# Patient Record
Sex: Female | Born: 1950
Health system: Southern US, Community
[De-identification: ages and names within clinical notes are randomized; demographics above are authoritative.]

## PROBLEM LIST (undated history)

## (undated) DIAGNOSIS — I10 Essential (primary) hypertension: Secondary | ICD-10-CM

## (undated) DIAGNOSIS — D869 Sarcoidosis, unspecified: Secondary | ICD-10-CM

## (undated) DIAGNOSIS — J449 Chronic obstructive pulmonary disease, unspecified: Secondary | ICD-10-CM

## (undated) HISTORY — DX: Sarcoidosis, unspecified: D86.9

---

## 2019-03-01 ENCOUNTER — Ambulatory Visit (INDEPENDENT_AMBULATORY_CARE_PROVIDER_SITE_OTHER): Payer: Medicare Other

## 2019-03-01 ENCOUNTER — Other Ambulatory Visit: Payer: Self-pay

## 2019-03-01 ENCOUNTER — Encounter: Payer: Self-pay | Admitting: Emergency Medicine

## 2019-03-01 ENCOUNTER — Ambulatory Visit
Admission: EM | Admit: 2019-03-01 | Discharge: 2019-03-01 | Disposition: A | Discharge status: Home / Self Care | Payer: Medicare Other | Attending: Physician Assistant | Admitting: Physician Assistant

## 2019-03-01 DIAGNOSIS — Z760 Encounter for issue of repeat prescription: Secondary | ICD-10-CM

## 2019-03-01 DIAGNOSIS — J4551 Severe persistent asthma with (acute) exacerbation: Secondary | ICD-10-CM

## 2019-03-01 DIAGNOSIS — R0602 Shortness of breath: Secondary | ICD-10-CM | POA: Diagnosis not present

## 2019-03-01 DIAGNOSIS — J209 Acute bronchitis, unspecified: Secondary | ICD-10-CM

## 2019-03-01 DIAGNOSIS — R062 Wheezing: Secondary | ICD-10-CM

## 2019-03-01 DIAGNOSIS — Z20822 Contact with and (suspected) exposure to covid-19: Secondary | ICD-10-CM | POA: Diagnosis not present

## 2019-03-01 HISTORY — DX: Essential (primary) hypertension: I10

## 2019-03-01 HISTORY — DX: Chronic obstructive pulmonary disease, unspecified: J44.9

## 2019-03-01 LAB — POC SARS CORONAVIRUS 2 AG -  ED: SARS Coronavirus 2 Ag: NEGATIVE

## 2019-03-01 MED ORDER — ALBUTEROL SULFATE (2.5 MG/3ML) 0.083% IN NEBU: 2.5000 mg | INHALATION_SOLUTION | Freq: Four times a day (QID) | RESPIRATORY_TRACT | 0 refills | Status: DC | PRN

## 2019-03-01 MED ORDER — DOXYCYCLINE HYCLATE 100 MG PO CAPS: 100.0000 mg | ORAL_CAPSULE | Freq: Two times a day (BID) | ORAL | 0 refills | Status: DC

## 2019-03-01 MED ORDER — HYDROCHLOROTHIAZIDE 25 MG PO TABS: 25.0000 mg | ORAL_TABLET | Freq: Every day | ORAL | 0 refills | Status: DC

## 2019-03-01 MED ORDER — LOSARTAN POTASSIUM 50 MG PO TABS: 50.0000 mg | ORAL_TABLET | Freq: Every day | ORAL | 0 refills | Status: DC

## 2019-03-01 MED ORDER — ALBUTEROL SULFATE HFA 108 (90 BASE) MCG/ACT IN AERS: 2.0000 | INHALATION_SPRAY | Freq: Four times a day (QID) | RESPIRATORY_TRACT | 0 refills | Status: DC | PRN

## 2019-03-01 MED ORDER — PREDNISONE 50 MG PO TABS: 50.0000 mg | ORAL_TABLET | Freq: Every day | ORAL | 0 refills | Status: DC

## 2019-03-01 MED ORDER — METHYLPREDNISOLONE SODIUM SUCC 125 MG IJ SOLR
80.0000 mg | Freq: Once | INTRAMUSCULAR | Status: AC
  Administered 2019-03-01: 15:00:00 80 mg via INTRAMUSCULAR

## 2019-03-01 MED ORDER — MONTELUKAST SODIUM 10 MG PO TABS: 10.0000 mg | ORAL_TABLET | Freq: Every day | ORAL | 0 refills | Status: DC

## 2019-03-01 NOTE — ED Provider Notes (Signed)
[facility]    CSN: [id number] Arrival date & time: 03/01/19  1334      History   Chief Complaint Chief Complaint  Patient presents with   Asthma    HPI Carolyn Morton is a 69 y.o. female.   69 year old female with history of HTN, asthma/COPD comes in for 2-3 week history of asthma exacerbation that has worsened in the past couple of days. Has been unable to do exert due to shortness of breath/wheezing. No chest pain. Rhinorrhea, nasal congestion, cough, sore throat started couple weeks ago as well. Denies fever, chills, body aches. Denies abdominal pain, nausea, vomiting, diarrhea. Denies shortness of breath. No COVID test since current symptom started. Using albuterol every 4 hours with temporary relief. Was on Breo for maintenance, but ran out 1 month ago due to finance.   Never needed to use prednisone. Never admitted for asthma. History of pulmonary sarcoidosis, has not had flareup for 30+ years.      Past Medical History:  Diagnosis Date   COPD (chronic obstructive pulmonary disease) ([place])    Hypertension     There are no problems to display for this patient.   History reviewed. No pertinent surgical history.  OB History   No obstetric history on file.      Home Medications    Prior to Admission medications   Medication Sig Start Date End Date Taking? Authorizing Provider  fluticasone furoate-vilanterol (BREO ELLIPTA) 200-25 MCG/INH AEPB Inhale 1 puff into the lungs daily.   Yes [provider]  albuterol (PROVENTIL) (2.5 MG/3ML) 0.083% nebulizer solution Take 3 mLs (2.5 mg total) by nebulization every 6 (six) hours as needed for wheezing or shortness of breath. 03/01/19   Tasia Catchings, Kevron Patella V, PA-C  albuterol (VENTOLIN HFA) 108 (90 Base) MCG/ACT inhaler Inhale 2 puffs into the lungs every 6 (six) hours as needed for wheezing or shortness of breath. 03/01/19   Tasia Catchings, Nohemi Nicklaus V, PA-C  doxycycline (VIBRAMYCIN) 100 MG capsule Take 1 capsule (100 mg total) by  mouth 2 (two) times daily. 03/01/19   Tasia Catchings, Kinzleigh Kandler V, PA-C  hydrochlorothiazide (HYDRODIURIL) 25 MG tablet Take 1 tablet (25 mg total) by mouth daily. 03/01/19   Tasia Catchings, Jermiya Reichl V, PA-C  losartan (COZAAR) 50 MG tablet Take 1 tablet (50 mg total) by mouth daily. 03/01/19   Tasia Catchings, Kirill Chatterjee V, PA-C  montelukast (SINGULAIR) 10 MG tablet Take 1 tablet (10 mg total) by mouth at bedtime. 03/01/19   Ok Edwards, PA-C  predniSONE (DELTASONE) 50 MG tablet Take 1 tablet (50 mg total) by mouth daily with breakfast. 03/01/19   Ok Edwards, PA-C    Family History History reviewed. No pertinent family history.  Social History Social History   Tobacco Use   Smoking status: Never Smoker   Smokeless tobacco: Never Used  Substance Use Topics   Alcohol use: Not Currently   Drug use: Never     Allergies   Patient has no known allergies.   Review of Systems Review of Systems  Reason unable to perform ROS: See HPI as above.     Physical Exam Triage Vital Signs ED Triage Vitals  Enc Vitals Group     BP 03/01/19 1351 (!) 190/71     Pulse Rate 03/01/19 1351 95     Resp 03/01/19 1351 (!) 22     Temp 03/01/19 1351 98.4 F (36.9 C)     Temp Source 03/01/19 1351 Oral     SpO2 03/01/19 1351 90 %  Weight --      Height --      Head Circumference --      Peak Flow --      Pain Score 03/01/19 1352 0     Pain Loc --      Pain Edu? --      Excl. in [place]? --    No data found.  Updated Vital Signs BP (!) 161/83 (BP Location: Left Arm)    Pulse 95    Temp 98.4 F (36.9 C) (Oral)    Resp (!) 22    SpO2 93%   Physical Exam Constitutional:      General: She is not in acute distress.    Appearance: Normal appearance. She is not ill-appearing, toxic-appearing or diaphoretic.  HENT:     Head: Normocephalic and atraumatic.     Mouth/Throat:     Mouth: Mucous membranes are moist.     Pharynx: Oropharynx is clear. Uvula midline.  Cardiovascular:     Rate and Rhythm: Normal rate and regular rhythm.     Heart sounds: Normal  heart sounds. No murmur. No friction rub. No gallop.   Pulmonary:     Effort: No accessory muscle usage, prolonged expiration or retractions.     Comments: Speaking full sentences without difficulty, though with audible wheezing.  Lung with diffuse inspiratory and expiratory wheezing, decreased air movement. Musculoskeletal:     Cervical back: Normal range of motion and neck supple.     Right lower leg: No edema.     Left lower leg: No edema.  Skin:    General: Skin is warm and dry.  Neurological:     General: No focal deficit present.     Mental Status: She is alert and oriented to person, place, and time.      UC Treatments / Results  Labs (all labs ordered are listed, but only abnormal results are displayed) Labs Reviewed  NOVEL CORONAVIRUS, NAA  POC SARS CORONAVIRUS 2 AG -  ED    EKG   Radiology DG Chest 2 View  Result Date: 03/01/2019 CLINICAL DATA:  69 year old female with wheezing shortness of breath and hypoxia EXAM: CHEST - 2 VIEW COMPARISON:  None. FINDINGS: Cardiomediastinal silhouette borderline enlarged. Calcifications of the aortic arch. No interlobular septal thickening. No pneumothorax or pleural effusion. Coarsened interstitial markings. No confluent airspace disease. Degenerative changes of the spine. IMPRESSION: Chronic lung changes without evidence of acute cardiopulmonary disease Electronically Signed   By: Gilmer Mor D.O.   On: 03/01/2019 14:31    Procedures Procedures (including critical care time)  Medications Ordered in UC Medications  methylPREDNISolone sodium succinate (SOLU-MEDROL) 125 mg/2 mL injection 80 mg (80 mg Intramuscular Given 03/01/19 1508)    Initial Impression / Assessment and Plan / UC Course  I have reviewed the triage vital signs and the nursing notes.  Pertinent labs & imaging results that were available during my care of the patient were reviewed by me and considered in my medical decision making (see chart for details).      69 year old female with history of asthma/COPD, pulmonary sarcoidosis, HTN comes in for 2 to 3 weeks of asthma exacerbation that has worsened in the past few days.  She is originally from [place], recently moved to [place].  Since moving, has not had Breo due to finances.  Has been using albuterol with temporary relief.  Denies history of prednisone use for asthma, denies history of admissions due to asthma exacerbations.  Patient was  tachypneic, with increased work of breathing at triage.  O2 sat ranging 87 to 90% on room air.  BP 190/71.  During exam, work of breathing normalized, and is able to speak in full sentences without tachypnea, though with audible wheezing.  Lungs with inspiratory and expiratory wheezing diffusely with decreased air movement.  Albuterol 4 puffs x1, good relief of symptoms.  Audible wheezing resolved.  Significantly improved air movement with inspiratory and expiratory wheezing to the periphery. O2 sat improved to 90-93%  Given history and exam, will obtain chest x-ray, rapid Covid.  Rapid Covid negative, will obtain PCR Covid.  Chest x-ray with chronic lung changes without evidence of acute cardiopulmonary disease.  Patient O2 sat ranging anywhere from 85% to 93%.  However, after albuterol inhaler treatment, O2 sat stable at 92 to 93%.  Given significant improvement of symptoms with albuterol, I believe her symptoms will improve with corticosteroid, DuoNeb/albuterol nebulizer treatments.  Unfortunately, unable to provide nebulizer treatments in office given Covid pandemic.  However, discussed concerns for hypoxia that may require admission.  At this time, discussed further evaluation at the ED versus treating for severe asthma exacerbation, and recheck in the morning.  Patient would like to defer ED visit at this time.  We will do Solu-Medrol injection in office.  Patient has nebulizer machine at home, to do 1 to 2-day DuoNeb treatments.  Start prednisone as directed.   Albuterol nebulizer/inhaler as needed.  If no worsening, follow-up tomorrow morning for recheck/reevaluation.  Discussed low threshold for ED visit.  Strict return precautions given.  Patient expresses understanding and agrees to plan.  Patient BP improved during visit, but does not currently have her BP meds.  Will refill HCTZ, losartan at this time.  Final Clinical Impressions(s) / UC Diagnoses   Final diagnoses:  Severe persistent asthma with acute exacerbation  Acute bronchitis, unspecified organism   ED Prescriptions    Medication Sig Dispense Auth. Provider   albuterol (PROVENTIL) (2.5 MG/3ML) 0.083% nebulizer solution Take 3 mLs (2.5 mg total) by nebulization every 6 (six) hours as needed for wheezing or shortness of breath. 75 mL Mareta Chesnut V, PA-C   albuterol (VENTOLIN HFA) 108 (90 Base) MCG/ACT inhaler Inhale 2 puffs into the lungs every 6 (six) hours as needed for wheezing or shortness of breath. 18 g Griffin Dewilde V, PA-C   predniSONE (DELTASONE) 50 MG tablet Take 1 tablet (50 mg total) by mouth daily with breakfast. 5 tablet Ferrel Simington V, PA-C   doxycycline (VIBRAMYCIN) 100 MG capsule Take 1 capsule (100 mg total) by mouth 2 (two) times daily. 20 capsule Khianna Blazina V, PA-C   hydrochlorothiazide (HYDRODIURIL) 25 MG tablet Take 1 tablet (25 mg total) by mouth daily. 30 tablet Marcey Persad V, PA-C   losartan (COZAAR) 50 MG tablet Take 1 tablet (50 mg total) by mouth daily. 30 tablet Camilla Skeen V, PA-C   montelukast (SINGULAIR) 10 MG tablet Take 1 tablet (10 mg total) by mouth at bedtime. 30 tablet Belinda Fisher, PA-C     PDMP not reviewed this encounter.   Belinda Fisher, PA-C 03/01/19 1555

## 2019-03-01 NOTE — ED Triage Notes (Signed)
Pt here for increased SOB with productive cough; wheezing noted with hx of asthma

## 2019-03-01 NOTE — Discharge Instructions (Signed)
Checks x-ray without pneumonia.  Rapid Covid negative.  As discussed, your oxygen level is lower than normal, this could improve with treatment and steroids, but may need to be at the emergency department.  Solu-Medrol injection in office today.  Covid testing ordered.  Please start prednisone, doxycycline as directed.  Please do 1-2 treatments of DuoNeb, and then you can continue with albuterol nebulizer treatments as needed.  If symptoms does not worsen overnight, please follow-up tomorrow morning for recheck.  If worsening symptoms, please go to the emergency department immediately for further evaluation.

## 2019-03-02 ENCOUNTER — Ambulatory Visit
Admission: EM | Admit: 2019-03-02 | Discharge: 2019-03-02 | Disposition: A | Discharge status: Home / Self Care | Payer: Medicare Other | Attending: Emergency Medicine | Admitting: Emergency Medicine

## 2019-03-02 ENCOUNTER — Encounter: Payer: Self-pay | Admitting: Emergency Medicine

## 2019-03-02 ENCOUNTER — Other Ambulatory Visit: Payer: Self-pay

## 2019-03-02 DIAGNOSIS — J4551 Severe persistent asthma with (acute) exacerbation: Secondary | ICD-10-CM

## 2019-03-02 DIAGNOSIS — J209 Acute bronchitis, unspecified: Secondary | ICD-10-CM

## 2019-03-02 LAB — NOVEL CORONAVIRUS, NAA: SARS-CoV-2, NAA: NOT DETECTED

## 2019-03-02 MED ORDER — BENZONATATE 100 MG PO CAPS: 100.0000 mg | ORAL_CAPSULE | Freq: Three times a day (TID) | ORAL | 0 refills | Status: DC

## 2019-03-02 NOTE — ED Provider Notes (Signed)
[facility]    CSN: [id number] Arrival date & time: 03/02/19  0843      History   Chief Complaint Chief Complaint  Patient presents with  . Asthma    HPI Carolyn Morton is a 69 y.o. female with history of hypertension, asthma/COPD presenting for repeat evaluation.  Patient was seen in this UC yesterday for severe persistent asthma with acute exacerbation.  Please see those records which were reviewed by me at time of visit.  Patient given IM Solu-Medrol in office and underwent rapid Covid testing: Negative-PCR's pending still at time of assessment today.  Patient was initially hypoxic 85% on room air, though after albuterol inhaler treatment got up to 93% on room air.  Patient was noted to have labored breathing, though due to improved oxygenation and appearance after albuterol inhaler, patient given nebulizer, prednisone, doxycycline for suspected asthma exacerbation and bronchitis as well as refill of home losartan, HCTZ, Singulair.  Since previous evaluation, patient has noticed significant improvement in her breathing.  States "the wheezing is gone ".  Has been compliant with all medications and is able to state how she took each medication.  Patient denies lightheadedness, dizziness, chest pain, shortness of breath in office today.  Past Medical History:  Diagnosis Date  . COPD (chronic obstructive pulmonary disease) ([facility])   . Hypertension     There are no problems to display for this patient.   History reviewed. No pertinent surgical history.  OB History   No obstetric history on file.      Home Medications    Prior to Admission medications   Medication Sig Start Date End Date Taking? Authorizing Provider  albuterol (PROVENTIL) (2.5 MG/3ML) 0.083% nebulizer solution Take 3 mLs (2.5 mg total) by nebulization every 6 (six) hours as needed for wheezing or shortness of breath. 03/01/19   Cathie Hoops, Amy V, PA-C  albuterol (VENTOLIN HFA) 108 (90 Base) MCG/ACT inhaler Inhale  2 puffs into the lungs every 6 (six) hours as needed for wheezing or shortness of breath. 03/01/19   Cathie Hoops, Amy V, PA-C  benzonatate (TESSALON) 100 MG capsule Take 1 capsule (100 mg total) by mouth every 8 (eight) hours. 03/02/19   Hall-Potvin, [place], PA-C  doxycycline (VIBRAMYCIN) 100 MG capsule Take 1 capsule (100 mg total) by mouth 2 (two) times daily. 03/01/19   Cathie Hoops, Amy V, PA-C  fluticasone furoate-vilanterol (BREO ELLIPTA) 200-25 MCG/INH AEPB Inhale 1 puff into the lungs daily.    [provider]  hydrochlorothiazide (HYDRODIURIL) 25 MG tablet Take 1 tablet (25 mg total) by mouth daily. 03/01/19   Cathie Hoops, Amy V, PA-C  losartan (COZAAR) 50 MG tablet Take 1 tablet (50 mg total) by mouth daily. 03/01/19   Cathie Hoops, Amy V, PA-C  montelukast (SINGULAIR) 10 MG tablet Take 1 tablet (10 mg total) by mouth at bedtime. 03/01/19   Belinda Fisher, PA-C  predniSONE (DELTASONE) 50 MG tablet Take 1 tablet (50 mg total) by mouth daily with breakfast. 03/01/19   Belinda Fisher, PA-C    Family History History reviewed. No pertinent family history.  Social History Social History   Tobacco Use  . Smoking status: Never Smoker  . Smokeless tobacco: Never Used  Substance Use Topics  . Alcohol use: Not Currently  . Drug use: Never     Allergies   Patient has no known allergies.   Review of Systems As per HPI   Physical Exam Triage Vital Signs ED Triage Vitals  Enc Vitals Group  BP 03/02/19 0851 (!) 187/104     Pulse Rate 03/02/19 0851 94     Resp 03/02/19 0851 20     Temp 03/02/19 0851 97.8 F (36.6 C)     Temp Source 03/02/19 0851 Temporal     SpO2 03/02/19 0851 90 %     Weight --      Height --      Head Circumference --      Peak Flow --      Pain Score 03/02/19 0852 0     Pain Loc --      Pain Edu? --      Excl. in [place]? --    No data found.  Updated Vital Signs BP (!) 187/104 (BP Location: Left Arm)   Pulse 94   Temp 97.8 F (36.6 C) (Temporal)   Resp 20   SpO2 90%   Visual Acuity Right Eye  Distance:   Left Eye Distance:   Bilateral Distance:    Right Eye Near:   Left Eye Near:    Bilateral Near:     Physical Exam Constitutional:      General: She is not in acute distress.    Appearance: She is obese. She is not ill-appearing or diaphoretic.  HENT:     Head: Normocephalic and atraumatic.     Mouth/Throat:     Mouth: Mucous membranes are moist.     Pharynx: Oropharynx is clear. No oropharyngeal exudate or posterior oropharyngeal erythema.  Eyes:     General: No scleral icterus.    Conjunctiva/sclera: Conjunctivae normal.     Pupils: Pupils are equal, round, and reactive to light.  Neck:     Comments: Trachea midline, negative JVD Cardiovascular:     Rate and Rhythm: Normal rate and regular rhythm.     Heart sounds: No murmur. No gallop.   Pulmonary:     Effort: Pulmonary effort is normal. No respiratory distress.     Breath sounds: No rhonchi or rales.     Comments: As compared to notes from previous visit yesterday, patient has normal rate and effort of breathing, very conversational today, and airflow is improved without wheezing Chest:     Chest wall: No tenderness.  Musculoskeletal:     Cervical back: Neck supple. No tenderness.  Lymphadenopathy:     Cervical: No cervical adenopathy.  Skin:    Capillary Refill: Capillary refill takes less than 2 seconds.     Coloration: Skin is not jaundiced or pale.     Findings: No rash.  Neurological:     General: No focal deficit present.     Mental Status: She is alert and oriented to person, place, and time.      UC Treatments / Results  Labs (all labs ordered are listed, but only abnormal results are displayed) Labs Reviewed - No data to display  EKG   Radiology   Procedures Procedures (including critical care time)  Medications Ordered in UC Medications - No data to display  Initial Impression / Assessment and Plan / UC Course  I have reviewed the triage vital signs and the nursing  notes.  Pertinent labs & imaging results that were available during my care of the patient were reviewed by me and considered in my medical decision making (see chart for details).     Patient afebrile, nontoxic in office today.  Patient ambulated into office, oxygen saturation 86% on room air, though quickly came up to 91%.  Patient waxing and  waning between 91-94% on room air.  No breathing treatments given today in office due to improvement.  Will await Covid PCR results, add Tessalon for trigger cough as this agitates patient.  This provider coordinated care: Patient has PCP appointment 03/16/2019: Intends to keep.  Strict ER return precautions discussed, patient verbalized understanding and is agreeable to plan.  Greater than 30 minutes were spent reviewing patient's case, at bedside, counseling, and coordinating care.  Final Clinical Impressions(s) / UC Diagnoses   Final diagnoses:  Severe persistent asthma with acute exacerbation  Acute bronchitis, unspecified organism     Discharge Instructions     Please use your spacing device to the albuterol inhaler. Please continue all medications given yesterday. If your Covid is positive, or you have difficulty breathing, chest pain, lightheadedness, nausea, please go to the ER for further evaluation.    ED Prescriptions    Medication Sig Dispense Auth. Provider   benzonatate (TESSALON) 100 MG capsule Take 1 capsule (100 mg total) by mouth every 8 (eight) hours. 21 capsule Hall-Potvin, [place], PA-C     PDMP not reviewed this encounter.   Hall-Potvin, [place], [place] 03/02/19 1706

## 2019-03-02 NOTE — ED Triage Notes (Signed)
Pt presents for re-check of O2 saturations after being seen yesterday for an asthma exacerbation.  Patient states she is feeling somewhat better.  States "the wheezing has stopped".

## 2019-03-02 NOTE — Discharge Instructions (Addendum)
Please use your spacing device to the albuterol inhaler. Please continue all medications given yesterday. If your Covid is positive, or you have difficulty breathing, chest pain, lightheadedness, nausea, please go to the ER for further evaluation.

## 2019-03-02 NOTE — ED Notes (Signed)
Patient able to ambulate independently  

## 2019-03-16 ENCOUNTER — Encounter: Payer: Self-pay | Admitting: Internal Medicine

## 2019-03-16 ENCOUNTER — Inpatient Hospital Stay: Payer: Medicare Other | Admitting: Internal Medicine

## 2019-03-16 ENCOUNTER — Ambulatory Visit (INDEPENDENT_AMBULATORY_CARE_PROVIDER_SITE_OTHER): Payer: Medicare Other | Admitting: Internal Medicine

## 2019-03-16 DIAGNOSIS — J45909 Unspecified asthma, uncomplicated: Secondary | ICD-10-CM | POA: Insufficient documentation

## 2019-03-16 DIAGNOSIS — Z7689 Persons encountering health services in other specified circumstances: Secondary | ICD-10-CM | POA: Diagnosis not present

## 2019-03-16 DIAGNOSIS — J4540 Moderate persistent asthma, uncomplicated: Secondary | ICD-10-CM | POA: Diagnosis not present

## 2019-03-16 DIAGNOSIS — I10 Essential (primary) hypertension: Secondary | ICD-10-CM

## 2019-03-16 MED ORDER — PREDNISONE 50 MG PO TABS: 50.0000 mg | ORAL_TABLET | Freq: Every day | ORAL | 0 refills | Status: DC

## 2019-03-16 NOTE — Progress Notes (Signed)
Virtual Visit via Telephone Note  I connected with Carolyn Morton, on 03/16/2019 at 3:12 PM by telephone due to the COVID-19 pandemic and verified that I am speaking with the correct person using two identifiers.   Consent: I discussed the limitations, risks, security and privacy concerns of performing an evaluation and management service by telephone and the availability of in person appointments. I also discussed with the patient that there may be a patient responsible charge related to this service. The patient expressed understanding and agreed to proceed.   Location of Patient: Home   Location of Provider: Home    Persons participating in Telemedicine visit: Wallace Going [facility] Dr. Juleen China      History of Present Illness: Patient has a visit to establish care. She has been to the urgent care twice for breathing. She is unsure if shsae has a diagnosis of asthma or COPD. Has been on Breo for about 3 years but currently does not have it because her insurance deductible was too high to afford. She has had trouble with breathing since she was a child. She finished her Doxycycline and Prednisone. Using albuterol inhaler and albuterol nebs about 3-4 times total per day. Has nighttime awakenings at least a few times per week. Denies chest pain. Also reports h/o of pulmonary sarcoidosis.  Has a history of HTN. Reports it had improved when she retired and her doctor in [place] was planning to take her off but since she moved here it has been high and she attributes that to difficulty with her wheezing. At home numbers have been 110-120 until recently.    Past Medical History:  Diagnosis Date  . COPD (chronic obstructive pulmonary disease) ([place])   . Hypertension    No Known Allergies  Current Outpatient Medications on File Prior to Visit  Medication Sig Dispense Refill  . albuterol (PROVENTIL) (2.5 MG/3ML) 0.083% nebulizer solution Take 3 mLs (2.5 mg total) by nebulization  every 6 (six) hours as needed for wheezing or shortness of breath. 75 mL 0  . albuterol (VENTOLIN HFA) 108 (90 Base) MCG/ACT inhaler Inhale 2 puffs into the lungs every 6 (six) hours as needed for wheezing or shortness of breath. 18 g 0  . benzonatate (TESSALON) 100 MG capsule Take 1 capsule (100 mg total) by mouth every 8 (eight) hours. 21 capsule 0  . fluticasone furoate-vilanterol (BREO ELLIPTA) 200-25 MCG/INH AEPB Inhale 1 puff into the lungs daily.    . hydrochlorothiazide (HYDRODIURIL) 25 MG tablet Take 1 tablet (25 mg total) by mouth daily. 30 tablet 0  . losartan (COZAAR) 50 MG tablet Take 1 tablet (50 mg total) by mouth daily. 30 tablet 0  . montelukast (SINGULAIR) 10 MG tablet Take 1 tablet (10 mg total) by mouth at bedtime. 30 tablet 0  . predniSONE (DELTASONE) 50 MG tablet Take 1 tablet (50 mg total) by mouth daily with breakfast. 5 tablet 0   No current facility-administered medications on file prior to visit.    Observations/Objective: NAD. Speaking clearly.  Patient wheezing on phone with periods of dry cough. Speaking in full sentences.  Alert and oriented. Mood appropriate.   Assessment and Plan: 1. Encounter to establish care   2. Moderate persistent asthma without complication Unclear if patient has COPD vs. Asthma. Does report breathing issues throughout her life, starting as a child making diagnosis of asthma more likely. No prior history of smoking, making COPD slightly less likely. Patient also reports history of pulmonary sarcoidosis. CXR on 2/7 showed  chronic lung changes with coarsened interstitial markings. No PFTs on file for review. Sounds to be having an acute exacerbation again, will treat with steroids. She may also benefit from a short acting bronchodilator such as Ipratropium with her exacerbations in the future. Suspect this is related to lack of a controller medication. Patient was unable to afford Breo. Seems to be her breathing status has worsened since  moving to  from [facility], there may be a component of environmental triggers as well. Will have patient have a consulting visit with Dr. Delford Field at [facility] for further assistance with management.  - predniSONE (DELTASONE) 50 MG tablet; Take 1 tablet (50 mg total) by mouth daily with breakfast.  Dispense: 5 tablet; Refill: 0  3. Essential hypertension BP sounds to be at goal at home. However, was significantly elevated at Urgent Care visits. Continue with current therapy for now. Will monitor in office.    Follow Up Instructions: Annual exam and screening labs; With Dr. Delford Field on 3/16    I discussed the assessment and treatment plan with the patient. The patient was provided an opportunity to ask questions and all were answered. The patient agreed with the plan and demonstrated an understanding of the instructions.   The patient was advised to call back or seek an in-person evaluation if the symptoms worsen or if the condition fails to improve as anticipated.     I provided 18 minutes total of non-face-to-face time during this encounter including median intraservice time, reviewing previous notes, investigations, ordering medications, medical decision making, coordinating care and patient verbalized understanding at the end of the visit.    Marcy Siren, D.O. Primary Care at [facility]  03/16/2019, 3:12 PM

## 2019-03-28 ENCOUNTER — Other Ambulatory Visit: Payer: Self-pay

## 2019-03-28 DIAGNOSIS — J4540 Moderate persistent asthma, uncomplicated: Secondary | ICD-10-CM

## 2019-03-30 ENCOUNTER — Other Ambulatory Visit: Payer: Self-pay | Admitting: Internal Medicine

## 2019-03-30 DIAGNOSIS — J4540 Moderate persistent asthma, uncomplicated: Secondary | ICD-10-CM

## 2019-03-30 MED ORDER — PREDNISONE 50 MG PO TABS: 50.0000 mg | ORAL_TABLET | Freq: Every day | ORAL | 0 refills | Status: DC

## 2019-04-06 NOTE — Progress Notes (Signed)
Subjective:    Patient ID: Carolyn Morton, female    DOB: 1951-01-19, 69 y.o.   MRN: [id number]  69 y.o.F here for pulmonary eval from PCP Earlene Plater Hx of severe asthma , two ED visits in Feb  The patient also has a history of sarcoidosis in the 1970s.  She had a liver biopsy that showed granulomas at that time.  She has had scarring in her lungs as well.  She had been on steroids in the past.  She recently moved here from [place] to be closer to her son is undergoing treatment for colon cancer.  She does note increased sinus congestion.  She wakes at night with shortness of breath notes increased wheezing.  She does have the albuterol inhaler and Singulair but she is not able to afford her combination inhalers Breo.  She did take prednisone for 5 days over a week ago and this had some improvement.    Shortness of Breath This is a chronic problem. The current episode started more than 1 year ago. The problem occurs daily. The problem has been rapidly improving (wiht pred pulse). Associated symptoms include a sore throat, sputum production and wheezing. Pertinent negatives include no abdominal pain, chest pain, ear pain, headaches, hemoptysis, leg pain, leg swelling, orthopnea, PND, rash, rhinorrhea, swollen glands or vomiting. The symptoms are aggravated by animal exposure, emotional upset, weather changes, odors, fumes and pollens (dogs). Risk factors include smoking. She has tried beta agonist inhalers and oral steroids for the symptoms. The treatment provided significant relief. Her past medical history is significant for asthma. (Sarcoid)    Past Medical History:  Diagnosis Date  . COPD (chronic obstructive pulmonary disease) (HCC)   . Hypertension      Family History  Family history unknown: Yes     Social History   Socioeconomic History  . Marital status: Widowed    Spouse name: Not on file  . Number of children: Not on file  . Years of education: Not on file  . Highest education  level: Not on file  Occupational History  . Not on file  Tobacco Use  . Smoking status: Never Smoker  . Smokeless tobacco: Never Used  Substance and Sexual Activity  . Alcohol use: Not Currently  . Drug use: Never  . Sexual activity: Not on file  Other Topics Concern  . Not on file  Social History Narrative  . Not on file   Social Determinants of Health   Financial Resource Strain:   . Difficulty of Paying Living Expenses:   Food Insecurity:   . Worried About Programme researcher, broadcasting/film/video in the Last Year:   . Barista in the Last Year:   Transportation Needs:   . Freight forwarder (Medical):   Marland Kitchen Lack of Transportation (Non-Medical):   Physical Activity:   . Days of Exercise per Week:   . Minutes of Exercise per Session:   Stress:   . Feeling of Stress :   Social Connections:   . Frequency of Communication with Friends and Family:   . Frequency of Social Gatherings with Friends and Family:   . Attends Religious Services:   . Active Member of Clubs or Organizations:   . Attends Banker Meetings:   Marland Kitchen Marital Status:   Intimate Partner Violence:   . Fear of Current or Ex-Partner:   . Emotionally Abused:   Marland Kitchen Physically Abused:   . Sexually Abused:      No Known  Allergies   Outpatient Medications Prior to Visit  Medication Sig Dispense Refill  . albuterol (PROVENTIL) (2.5 MG/3ML) 0.083% nebulizer solution Take 3 mLs (2.5 mg total) by nebulization every 6 (six) hours as needed for wheezing or shortness of breath. 75 mL 0  . albuterol (VENTOLIN HFA) 108 (90 Base) MCG/ACT inhaler Inhale 2 puffs into the lungs every 6 (six) hours as needed for wheezing or shortness of breath. 18 g 0  . hydrochlorothiazide (HYDRODIURIL) 25 MG tablet Take 1 tablet (25 mg total) by mouth daily. 30 tablet 0  . losartan (COZAAR) 50 MG tablet Take 1 tablet (50 mg total) by mouth daily. 30 tablet 0  . montelukast (SINGULAIR) 10 MG tablet Take 1 tablet (10 mg total) by mouth at  bedtime. 30 tablet 0  . benzonatate (TESSALON) 100 MG capsule Take 1 capsule (100 mg total) by mouth every 8 (eight) hours. (Patient not taking: Reported on 03/16/2019) 21 capsule 0  . fluticasone furoate-vilanterol (BREO ELLIPTA) 200-25 MCG/INH AEPB Inhale 1 puff into the lungs daily.    . predniSONE (DELTASONE) 50 MG tablet Take 1 tablet (50 mg total) by mouth daily with breakfast. (Patient not taking: Reported on 04/07/2019) 5 tablet 0   No facility-administered medications prior to visit.     Review of Systems  HENT: Positive for sore throat. Negative for ear pain and rhinorrhea.   Respiratory: Positive for sputum production, shortness of breath and wheezing. Negative for hemoptysis.   Cardiovascular: Negative for chest pain, orthopnea, leg swelling and PND.  Gastrointestinal: Negative for abdominal pain and vomiting.  Skin: Negative for rash.  Neurological: Negative for headaches.       Objective:   Physical Exam Vitals:   04/07/19 1008  BP: 129/84  Pulse: 80  SpO2: 92%  Weight: 203 lb 3.2 oz (92.2 kg)  Height: 5\' 3"  (1.6 m)    Gen: Pleasant, well-nourished, in no distress,  normal affect  ENT: No lesions,  mouth clear,  oropharynx clear, no postnasal drip  Neck: No JVD, no TMG, no carotid bruits  Lungs: No use of accessory muscles, no dullness to percussion, expired wheezes with poor airflow   cardiovascular: RRR, heart sounds normal, no murmur or gallops, no peripheral edema  Abdomen: soft and NT, no HSM,  BS normal  Musculoskeletal: No deformities, no cyanosis or clubbing  Neuro: alert, non focal  Skin: Warm, no lesions or rashes  Chest x-ray from February 2021 shows increased interstitial markings at the bases of the lungs and cardiomegaly       Assessment & Plan:  I personally reviewed all images and lab data in the [facility] system as well as any outside material available during this office visit and agree with the  radiology impressions.   Asthma History  and physical is compatible with severe persistent asthma  Plan will be to increase Flonase to 2 sprays each nostril daily and obtain Advair 200 strength 2 inhalations twice daily, she will continue Singulair 10 mg daily refills were given  We will also obtain a pulmonary function study  Sarcoidosis Sarcoidosis with history of liver biopsy we will check angiotensin-converting enzyme level to see if there is disease activity  Essential hypertension Patient is having difficulty with affording her co-pays so we combine the losartan and HCT to a 50/12.5 mg tablet I gave her a 90-day supply of this along with the Singulair to assist her with her co-pay costs   Diagnoses and all orders for this visit:  Moderate persistent  asthma without complication -     Pulmonary Function Test; Future  Sarcoidosis -     Angiotensin converting enzyme; Future -     Pulmonary Function Test; Future -     Angiotensin converting enzyme  Encounter for preoperative screening laboratory testing for COVID-19 virus -     Novel Coronavirus, NAA ([facility])  Essential hypertension  Other orders -     montelukast (SINGULAIR) 10 MG tablet; Take 1 tablet (10 mg total) by mouth at bedtime. -     losartan-hydrochlorothiazide (HYZAAR) 50-12.5 MG tablet; Take 1 tablet by mouth daily. -     fluticasone-salmeterol (ADVAIR HFA) 230-21 MCG/ACT inhaler; Inhale 2 puffs into the lungs 2 (two) times daily. -     albuterol (VENTOLIN HFA) 108 (90 Base) MCG/ACT inhaler; Inhale 2 puffs into the lungs every 6 (six) hours as needed for wheezing or shortness of breath. -     albuterol (PROVENTIL) (2.5 MG/3ML) 0.083% nebulizer solution; Take 3 mLs (2.5 mg total) by nebulization every 6 (six) hours as needed for wheezing or shortness of breath. -     fluticasone (FLONASE) 50 MCG/ACT nasal spray; Place 2 sprays into both nostrils daily.

## 2019-04-07 ENCOUNTER — Other Ambulatory Visit: Payer: Self-pay | Admitting: Critical Care Medicine

## 2019-04-07 ENCOUNTER — Other Ambulatory Visit: Payer: Self-pay

## 2019-04-07 ENCOUNTER — Encounter: Payer: Self-pay | Admitting: Critical Care Medicine

## 2019-04-07 ENCOUNTER — Ambulatory Visit: Payer: Medicare Other | Attending: Critical Care Medicine | Admitting: Critical Care Medicine

## 2019-04-07 VITALS — BP 129/84 | HR 80 | Ht 63.0 in | Wt 203.2 lb

## 2019-04-07 DIAGNOSIS — D869 Sarcoidosis, unspecified: Secondary | ICD-10-CM

## 2019-04-07 DIAGNOSIS — Z20822 Contact with and (suspected) exposure to covid-19: Secondary | ICD-10-CM | POA: Diagnosis not present

## 2019-04-07 DIAGNOSIS — I10 Essential (primary) hypertension: Secondary | ICD-10-CM | POA: Diagnosis not present

## 2019-04-07 DIAGNOSIS — J4540 Moderate persistent asthma, uncomplicated: Secondary | ICD-10-CM | POA: Diagnosis not present

## 2019-04-07 MED ORDER — ALBUTEROL SULFATE (2.5 MG/3ML) 0.083% IN NEBU: 2.5000 mg | INHALATION_SOLUTION | Freq: Four times a day (QID) | RESPIRATORY_TRACT | 0 refills | Status: DC | PRN

## 2019-04-07 MED ORDER — LOSARTAN POTASSIUM-HCTZ 50-12.5 MG PO TABS: 1.0000 | ORAL_TABLET | Freq: Every day | ORAL | 2 refills | Status: DC

## 2019-04-07 MED ORDER — MONTELUKAST SODIUM 10 MG PO TABS: 10.0000 mg | ORAL_TABLET | Freq: Every day | ORAL | 2 refills | Status: DC

## 2019-04-07 MED ORDER — ADVAIR HFA 230-21 MCG/ACT IN AERO: 2.0000 | INHALATION_SPRAY | Freq: Two times a day (BID) | RESPIRATORY_TRACT | 12 refills | Status: DC

## 2019-04-07 MED ORDER — ALBUTEROL SULFATE HFA 108 (90 BASE) MCG/ACT IN AERS: 2.0000 | INHALATION_SPRAY | Freq: Four times a day (QID) | RESPIRATORY_TRACT | 0 refills | Status: DC | PRN

## 2019-04-07 MED ORDER — FLUTICASONE PROPIONATE 50 MCG/ACT NA SUSP: 2.0000 | Freq: Every day | NASAL | 6 refills | Status: AC

## 2019-04-07 NOTE — Progress Notes (Signed)
New Patient  SOB

## 2019-04-07 NOTE — Assessment & Plan Note (Signed)
History and physical is compatible with severe persistent asthma  Plan will be to increase Flonase to 2 sprays each nostril daily and obtain Advair 200 strength 2 inhalations twice daily, she will continue Singulair 10 mg daily refills were given  We will also obtain a pulmonary function study

## 2019-04-07 NOTE — Assessment & Plan Note (Signed)
Sarcoidosis with history of liver biopsy we will check angiotensin-converting enzyme level to see if there is disease activity

## 2019-04-07 NOTE — Assessment & Plan Note (Signed)
Patient is having difficulty with affording her co-pays so we combine the losartan and HCT to a 50/12.5 mg tablet I gave her a 90-day supply of this along with the Singulair to assist her with her co-pay costs

## 2019-04-07 NOTE — Patient Instructions (Addendum)
A lung function test will be obtained  Labs today include angiotensin-converting enzyme assay  Start Advair 230 two puffs twice daily  Return to see Dr. Delford Field 1 month

## 2019-04-08 LAB — ANGIOTENSIN CONVERTING ENZYME: Angio Convert Enzyme: 31 U/L (ref 14–82)

## 2019-05-08 ENCOUNTER — Other Ambulatory Visit (HOSPITAL_COMMUNITY)
Admission: RE | Admit: 2019-05-08 | Discharge: 2019-05-08 | Disposition: A | Discharge status: Home / Self Care | Payer: Medicare Other | Source: Ambulatory Visit | Attending: Critical Care Medicine | Admitting: Critical Care Medicine

## 2019-05-08 DIAGNOSIS — Z01812 Encounter for preprocedural laboratory examination: Secondary | ICD-10-CM | POA: Diagnosis present

## 2019-05-08 DIAGNOSIS — Z20822 Contact with and (suspected) exposure to covid-19: Secondary | ICD-10-CM | POA: Insufficient documentation

## 2019-05-08 LAB — SARS CORONAVIRUS 2 (TAT 6-24 HRS): SARS Coronavirus 2: NEGATIVE

## 2019-05-11 ENCOUNTER — Ambulatory Visit (HOSPITAL_COMMUNITY)
Admission: RE | Admit: 2019-05-11 | Discharge: 2019-05-11 | Disposition: A | Discharge status: Home / Self Care | Payer: Medicare Other | Source: Ambulatory Visit | Attending: Critical Care Medicine | Admitting: Critical Care Medicine

## 2019-05-11 ENCOUNTER — Other Ambulatory Visit: Payer: Self-pay

## 2019-05-11 DIAGNOSIS — J4540 Moderate persistent asthma, uncomplicated: Secondary | ICD-10-CM | POA: Diagnosis present

## 2019-05-11 DIAGNOSIS — D869 Sarcoidosis, unspecified: Secondary | ICD-10-CM | POA: Diagnosis present

## 2019-05-11 LAB — PULMONARY FUNCTION TEST
DL/VA % pred: 88 %
DL/VA: 3.78 ml/min/mmHg/L
DLCO unc % pred: 59 %
DLCO unc: 10.1 ml/min/mmHg
FEF 25-75 Post: 0.6 L/sec
FEF 25-75 Pre: 0.45 L/sec
FEF2575-%Change-Post: 32 %
FEF2575-%Pred-Post: 41 %
FEF2575-%Pred-Pre: 31 %
FEV1-%Change-Post: 7 %
FEV1-%Pred-Post: 60 %
FEV1-%Pred-Pre: 56 %
FEV1-Post: 0.92 L
FEV1-Pre: 0.86 L
FEV1FVC-%Change-Post: 2 %
FEV1FVC-%Pred-Pre: 87 %
FEV6-%Change-Post: 4 %
FEV6-%Pred-Post: 70 %
FEV6-%Pred-Pre: 67 %
FEV6-Post: 1.32 L
FEV6-Pre: 1.26 L
FEV6FVC-%Change-Post: 0 %
FEV6FVC-%Pred-Post: 104 %
FEV6FVC-%Pred-Pre: 104 %
FVC-%Change-Post: 4 %
FVC-%Pred-Post: 67 %
FVC-%Pred-Pre: 64 %
FVC-Post: 1.32 L
FVC-Pre: 1.26 L
Post FEV1/FVC ratio: 69 %
Post FEV6/FVC ratio: 100 %
Pre FEV1/FVC ratio: 68 %
Pre FEV6/FVC Ratio: 100 %
RV % pred: 178 %
RV: 3.51 L
TLC % pred: 114 %
TLC: 5.09 L

## 2019-05-11 MED ORDER — ALBUTEROL SULFATE (2.5 MG/3ML) 0.083% IN NEBU
2.5000 mg | INHALATION_SOLUTION | Freq: Once | RESPIRATORY_TRACT | Status: AC
  Administered 2019-05-11: 2.5 mg via RESPIRATORY_TRACT

## 2019-05-13 ENCOUNTER — Other Ambulatory Visit: Payer: Self-pay

## 2019-05-13 ENCOUNTER — Encounter (HOSPITAL_COMMUNITY): Payer: Self-pay | Admitting: *Deleted

## 2019-05-13 ENCOUNTER — Telehealth (HOSPITAL_COMMUNITY): Payer: Self-pay

## 2019-05-13 ENCOUNTER — Ambulatory Visit: Payer: Medicare Other | Attending: Critical Care Medicine | Admitting: Critical Care Medicine

## 2019-05-13 ENCOUNTER — Encounter: Payer: Self-pay | Admitting: Critical Care Medicine

## 2019-05-13 VITALS — BP 138/76 | HR 92 | Temp 97.9°F | Resp 16 | Wt 203.6 lb

## 2019-05-13 DIAGNOSIS — H1013 Acute atopic conjunctivitis, bilateral: Secondary | ICD-10-CM | POA: Insufficient documentation

## 2019-05-13 DIAGNOSIS — J432 Centrilobular emphysema: Secondary | ICD-10-CM | POA: Diagnosis not present

## 2019-05-13 MED ORDER — LORATADINE 10 MG PO TABS: 10.0000 mg | ORAL_TABLET | Freq: Every day | ORAL | 4 refills | Status: DC

## 2019-05-13 MED ORDER — OLOPATADINE HCL 0.1 % OP SOLN: 1.0000 [drp] | Freq: Two times a day (BID) | OPHTHALMIC | 2 refills | Status: DC

## 2019-05-13 MED ORDER — SPIRIVA RESPIMAT 2.5 MCG/ACT IN AERS: INHALATION_SPRAY | RESPIRATORY_TRACT | 11 refills | Status: DC

## 2019-05-13 MED ORDER — BENZONATATE 100 MG PO CAPS: 100.0000 mg | ORAL_CAPSULE | Freq: Two times a day (BID) | ORAL | 0 refills | Status: DC | PRN

## 2019-05-13 NOTE — Patient Instructions (Addendum)
Pulmonary rehab referral was made  Start Spiriva two puff daily  Stay on Advair  Albuterol as needed  Patanol eye drops for eye irritation  Start loratidine daily for allergies  Resume flonase  Obtain Covid vaccine  Return 2 months  COVID-19 Vaccine Information can be found at: [email or web address] For questions related to vaccine distribution or appointments, please email [email or web address] or call [phone].

## 2019-05-13 NOTE — Progress Notes (Signed)
Subjective:    Patient ID: Carolyn Morton, female    DOB: 08/04/1950, 69 y.o.   MRN: [id number]  69 y.o.F here for pulmonary eval from PCP Carolyn Morton Hx of severe asthma , two ED visits in Feb  The patient also has a history of sarcoidosis in the 1970s.  She had a liver biopsy that showed granulomas at that time.  She has had scarring in her lungs as well.  She had been on steroids in the past.  She recently moved here from [place] to be closer to her son is undergoing treatment for colon cancer.  She does note increased sinus congestion.  She wakes at night with shortness of breath notes increased wheezing.  She does have the albuterol inhaler and Singulair but she is not able to afford her combination inhalers Breo.  She did take prednisone for 5 days over a week ago and this had some improvement.  05/13/2019 The patient returns today in follow-up after having had pulmonary function studies.  The patient's level of dyspnea is slightly improved on Advair but she still gets dyspneic with exertion and wakens at night with shortness of breath.  There is minimal dry cough.  Pulmonary functions do demonstrate reduction in diffusion capacity significant air trapping and severe peripheral airflow obstruction and moderate central airflow obstruction. Patient notes she had increased cough the last few weeks and is using Tessalon Perles.  She also notes eye irritation with the increased pollen as well.  She also notes increased nasal congestion.  Shortness of Breath This is a chronic problem. The current episode started more than 1 year ago. The problem occurs daily. The problem has been rapidly improving (wiht pred pulse). Associated symptoms include rhinorrhea, a sore throat and sputum production. Pertinent negatives include no abdominal pain, chest pain, ear pain, headaches, hemoptysis, leg pain, leg swelling, orthopnea, PND, rash, swollen glands, vomiting or wheezing. The symptoms are aggravated by animal  exposure, emotional upset, weather changes, odors, fumes and pollens (dogs). Associated symptoms comments: Mucus is clear Nose stopped up, eyes burning, sinus pressure on Left side Dry scratchy throat, better with gargle with salt water. Risk factors include smoking. She has tried beta agonist inhalers and oral steroids for the symptoms. The treatment provided significant relief. Her past medical history is significant for asthma and COPD. (Sarcoid)    Past Medical History:  Diagnosis Date  . COPD (chronic obstructive pulmonary disease) (HCC)   . Hypertension      Family History  Family history unknown: Yes     Social History   Socioeconomic History  . Marital status: Widowed    Spouse name: Not on file  . Number of children: Not on file  . Years of education: Not on file  . Highest education level: Not on file  Occupational History  . Not on file  Tobacco Use  . Smoking status: Never Smoker  . Smokeless tobacco: Never Used  Substance and Sexual Activity  . Alcohol use: Not Currently  . Drug use: Never  . Sexual activity: Not on file  Other Topics Concern  . Not on file  Social History Narrative  . Not on file   Social Determinants of Health   Financial Resource Strain:   . Difficulty of Paying Living Expenses:   Food Insecurity:   . Worried About Programme researcher, broadcasting/film/video in the Last Year:   . [employer] of Food in the Last Year:   Transportation Needs:   . Lack of  Transportation (Medical):   Marland Kitchen Lack of Transportation (Non-Medical):   Physical Activity:   . Days of Exercise per Week:   . Minutes of Exercise per Session:   Stress:   . Feeling of Stress :   Social Connections:   . Frequency of Communication with Friends and Family:   . Frequency of Social Gatherings with Friends and Family:   . Attends Religious Services:   . Active Member of Clubs or Organizations:   . Attends Archivist Meetings:   Marland Kitchen Marital Status:   Intimate Partner Violence:   . Fear of  Current or Ex-Partner:   . Emotionally Abused:   Marland Kitchen Physically Abused:   . Sexually Abused:      No Known Allergies   Outpatient Medications Prior to Visit  Medication Sig Dispense Refill  . albuterol (PROVENTIL) (2.5 MG/3ML) 0.083% nebulizer solution Take 3 mLs (2.5 mg total) by nebulization every 6 (six) hours as needed for wheezing or shortness of breath. 75 mL 0  . albuterol (VENTOLIN HFA) 108 (90 Base) MCG/ACT inhaler INHALE 2 PUFFS INTO THE LUNGS EVERY 6 HOURS AS NEEDED FOR WHEEZING OR SHORTNESS OF BREATH 54 g 1  . fluticasone (FLONASE) 50 MCG/ACT nasal spray Place 2 sprays into both nostrils daily. 16 g 6  . fluticasone-salmeterol (ADVAIR HFA) 230-21 MCG/ACT inhaler Inhale 2 puffs into the lungs 2 (two) times daily. 1 Inhaler 12  . losartan-hydrochlorothiazide (HYZAAR) 50-12.5 MG tablet Take 1 tablet by mouth daily. 90 tablet 2  . montelukast (SINGULAIR) 10 MG tablet Take 1 tablet (10 mg total) by mouth at bedtime. 90 tablet 2   No facility-administered medications prior to visit.     Review of Systems  HENT: Positive for rhinorrhea and sore throat. Negative for ear pain.   Respiratory: Positive for sputum production and shortness of breath. Negative for hemoptysis and wheezing.   Cardiovascular: Negative for chest pain, orthopnea, leg swelling and PND.  Gastrointestinal: Negative for abdominal pain and vomiting.  Skin: Negative for rash.  Neurological: Negative for headaches.       Objective:   Physical Exam Vitals:   05/13/19 0913  BP: 138/76  Pulse: 92  Resp: 16  Temp: 97.9 F (36.6 C)  SpO2: 92%  Weight: 203 lb 9.6 oz (92.4 kg)    Gen: Pleasant, well-nourished, in no distress,  normal affect  ENT: No lesions,  mouth clear,  oropharynx clear, 2+ postnasal drip, nasal turbinate edema without purulence  Neck: No JVD, no TMG, no carotid bruits  Lungs: No use of accessory muscles, no dullness to percussion, distant breath sounds but improved airflow  cardiovascular: RRR, heart sounds normal, no murmur or gallops, no peripheral edema  Abdomen: soft and NT, no HSM,  BS normal  Musculoskeletal: No deformities, no cyanosis or clubbing  Neuro: alert, non focal  Skin: Warm, no lesions or rashes  Chest x-ray from February 2021 shows increased interstitial markings at the bases of the lungs and cardiomegaly PFTS  Ref Range & Units 2 d ago  FVC-Pre L 1.26   FVC-%Pred-Pre % 64   FVC-Post L 1.32   FVC-%Pred-Post % 67   FVC-%Change-Post % 4   FEV1-Pre L 0.86   FEV1-%Pred-Pre % 56   FEV1-Post L 0.92   FEV1-%Pred-Post % 60   FEV1-%Change-Post % 7   FEV6-Pre L 1.26   FEV6-%Pred-Pre % 67   FEV6-Post L 1.32   FEV6-%Pred-Post % 70   FEV6-%Change-Post % 4   Pre FEV1/FVC ratio % 68  FEV1FVC-%Pred-Pre % 87   Post FEV1/FVC ratio % 69   FEV1FVC-%Change-Post % 2   Pre FEV6/FVC Ratio % 100   FEV6FVC-%Pred-Pre % 104   Post FEV6/FVC ratio % 100   FEV6FVC-%Pred-Post % 104   FEV6FVC-%Change-Post % 0   FEF 25-75 Pre L/sec 0.45   FEF2575-%Pred-Pre % 31   FEF 25-75 Post L/sec 0.60   FEF2575-%Pred-Post % 41   FEF2575-%Change-Post % 32   RV L 3.51   RV % pred % 178   TLC L 5.09   TLC % pred % 114   DLCO unc ml/min/mmHg 10.10   DLCO unc % pred % 59   DL/VA ml/min/mmHg/L 1.95   DL/VA % pred % 88          Assessment & Plan:  I personally reviewed all images and lab data in the [facility] system as well as any outside material available during this office visit and agree with the  radiology impressions.   Centrilobular emphysema (HCC)Gold B with asthma overlap syndrome Pulmonary functions most consistent with centrilobular emphysema with chronic obstructive lung disease Gold stage B with asthma overlap syndrome  Plan is to add Spiriva 2 puffs daily Respimat and continue Advair as prescribed  We will discontinue further Singulair and begin Flonase 2 sprays each nostril daily  Patient be referred to pulmonary rehab  We will also begin  Patanol eyedrops for allergic conjunctivitis and also begin loratadine daily   [place] was seen today for asthma.  Diagnoses and all orders for this visit:  Centrilobular emphysema (HCC) -     Pulmonary rehab therapeutic exercise; Future -     AMB referral to pulmonary rehabilitation  Other orders -     olopatadine (PATANOL) 0.1 % ophthalmic solution; Place 1 drop into both eyes 2 (two) times daily. -     loratadine (CLARITIN) 10 MG tablet; Take 1 tablet (10 mg total) by mouth daily. -     Tiotropium Bromide Monohydrate (SPIRIVA RESPIMAT) 2.5 MCG/ACT AERS; Two puff daily -     benzonatate (TESSALON) 100 MG capsule; Take 1 capsule (100 mg total) by mouth 2 (two) times daily as needed for cough.

## 2019-05-13 NOTE — Assessment & Plan Note (Signed)
Pulmonary functions most consistent with centrilobular emphysema with chronic obstructive lung disease Gold stage B with asthma overlap syndrome  Plan is to add Spiriva 2 puffs daily Respimat and continue Advair as prescribed  We will discontinue further Singulair and begin Flonase 2 sprays each nostril daily  Patient be referred to pulmonary rehab  We will also begin Patanol eyedrops for allergic conjunctivitis and also begin loratadine daily

## 2019-05-13 NOTE — Progress Notes (Signed)
Received referral from Dr. Shan Levans for this pt to participate in pulmonary rehab with the the diagnosis of Ccentrilobular Emphysema . Clinical review of pt follow up appt on 05/13/19 community health and wellness office note and 2/22 with PCP.  Pt recently moved from [place] to [place] and is establishing medical care.  Pt has noticed that his shortness of breath has worsen since moving to [place].  Pt will have PFT.  This has not been scheduled as of yet. Pt with Covid Risk Score - 4. Pt appropriate for scheduling for Pulmonary rehab.  Will forward to support staff for verification of insurance eligibility/benefits and to pulmonary rehab staff for scheduling  with pt consent. Alanson Aly, BSN Cardiac and Emergency planning/management officer

## 2019-05-13 NOTE — Telephone Encounter (Signed)
Pt insurance is active and benefits verified through [facility] Medicare Co-pay $20, DED 0/0 met, out of pocket $3,900/$45.95 met, co-insurance 0%. no pre-authorization required. Passport, 05/13/2019@1 :49pm, REF# [id number]

## 2019-05-20 ENCOUNTER — Telehealth (HOSPITAL_COMMUNITY): Payer: Self-pay | Admitting: *Deleted

## 2019-05-27 ENCOUNTER — Other Ambulatory Visit: Payer: Self-pay | Admitting: Internal Medicine

## 2019-05-27 DIAGNOSIS — F329 Major depressive disorder, single episode, unspecified: Secondary | ICD-10-CM | POA: Insufficient documentation

## 2019-05-27 DIAGNOSIS — F32A Depression, unspecified: Secondary | ICD-10-CM

## 2019-05-27 MED ORDER — DULOXETINE HCL 20 MG PO CPEP: 20.0000 mg | ORAL_CAPSULE | Freq: Every day | ORAL | 1 refills | Status: DC

## 2019-06-10 ENCOUNTER — Telehealth (HOSPITAL_COMMUNITY): Payer: Self-pay | Admitting: *Deleted

## 2019-07-06 ENCOUNTER — Telehealth: Payer: Self-pay | Admitting: Internal Medicine

## 2019-07-07 ENCOUNTER — Encounter: Payer: Self-pay | Admitting: Internal Medicine

## 2019-07-07 ENCOUNTER — Telehealth (INDEPENDENT_AMBULATORY_CARE_PROVIDER_SITE_OTHER): Payer: Medicare Other | Admitting: Internal Medicine

## 2019-07-07 DIAGNOSIS — J029 Acute pharyngitis, unspecified: Secondary | ICD-10-CM | POA: Diagnosis not present

## 2019-07-07 MED ORDER — AMOXICILLIN-POT CLAVULANATE 875-125 MG PO TABS: 1.0000 | ORAL_TABLET | Freq: Two times a day (BID) | ORAL | 0 refills | Status: DC

## 2019-07-07 MED ORDER — LIDOCAINE VISCOUS HCL 2 % MT SOLN: OROMUCOSAL | 0 refills | Status: DC

## 2019-07-07 NOTE — Progress Notes (Signed)
Virtual Visit via Telephone Note  I connected with Carolyn Morton, on 07/07/2019 at 9:31 AM by telephone due to the COVID-19 pandemic and verified that I am speaking with the correct person using two identifiers.   Consent: I discussed the limitations, risks, security and privacy concerns of performing an evaluation and management service by telephone and the availability of in person appointments. I also discussed with the patient that there may be a patient responsible charge related to this service. The patient expressed understanding and agreed to proceed.   Location of Patient: Home   Location of Provider: Clinic    Persons participating in Telemedicine visit: Deanna Wiater Going [facility] Dr. Juleen China   History of Present Illness: Patient has a visit for concerns about illness. Reports has non-stop sore throat. Has had problems with her sinuses all her life. Had surgery on her sinuses a few years ago but still gets hit with a sinus infection every now and then. Has been dealing with this for several weeks. Over the weekend was doing salt water gargles which helped with the pain and relieved some of the discomfort. Has been taking Tylenol and Ibuprofen. Having postnasal drip at nighttime. No fevers. Denies new cough. Has trouble seeing into the back of her throat to see if there is any pus or tonsillar edema. No trouble with swallowing liquids or solids.    Past Medical History:  Diagnosis Date  . COPD (chronic obstructive pulmonary disease) ([place])   . Hypertension    No Known Allergies  Current Outpatient Medications on File Prior to Visit  Medication Sig Dispense Refill  . albuterol (PROVENTIL) (2.5 MG/3ML) 0.083% nebulizer solution Take 3 mLs (2.5 mg total) by nebulization every 6 (six) hours as needed for wheezing or shortness of breath. 75 mL 0  . albuterol (VENTOLIN HFA) 108 (90 Base) MCG/ACT inhaler INHALE 2 PUFFS INTO THE LUNGS EVERY 6 HOURS AS NEEDED FOR WHEEZING OR  SHORTNESS OF BREATH 54 g 1  . DULoxetine (CYMBALTA) 20 MG capsule Take 1 capsule (20 mg total) by mouth daily. 90 capsule 1  . fluticasone (FLONASE) 50 MCG/ACT nasal spray Place 2 sprays into both nostrils daily. 16 g 6  . fluticasone-salmeterol (ADVAIR HFA) 230-21 MCG/ACT inhaler Inhale 2 puffs into the lungs 2 (two) times daily. 1 Inhaler 12  . loratadine (CLARITIN) 10 MG tablet Take 1 tablet (10 mg total) by mouth daily. 30 tablet 4  . losartan-hydrochlorothiazide (HYZAAR) 50-12.5 MG tablet Take 1 tablet by mouth daily. 90 tablet 2  . olopatadine (PATANOL) 0.1 % ophthalmic solution Place 1 drop into both eyes 2 (two) times daily. 5 mL 2  . Tiotropium Bromide Monohydrate (SPIRIVA RESPIMAT) 2.5 MCG/ACT AERS Two puff daily 4 g 11   No current facility-administered medications on file prior to visit.    Observations/Objective: NAD. Speaking clearly.  Work of breathing normal.  Alert and oriented. Mood appropriate.   Assessment and Plan: 1. Sore throat Limited due to virtual visit. Will treat with antibiotic that would cover bacterial sinusitis as well as streptococcal sore throat given duration of symptoms. Lidocaine gargles for pain relief. No red flag symptoms on history. Return if not improving with treatment plan.  - amoxicillin-clavulanate (AUGMENTIN) 875-125 MG tablet; Take 1 tablet by mouth 2 (two) times daily.  Dispense: 20 tablet; Refill: 0 - lidocaine (XYLOCAINE) 2 % solution; Gargle and spit out 15 mL every 6h prn throat pain.  Dispense: 100 mL; Refill: 0   Follow Up Instructions: PRN and for  routine medical care    I discussed the assessment and treatment plan with the patient. The patient was provided an opportunity to ask questions and all were answered. The patient agreed with the plan and demonstrated an understanding of the instructions.   The patient was advised to call back or seek an in-person evaluation if the symptoms worsen or if the condition fails to improve as  anticipated.     I provided 12 minutes total of non-face-to-face time during this encounter including median intraservice time, reviewing previous notes, investigations, ordering medications, medical decision making, coordinating care and patient verbalized understanding at the end of the visit.    Marcy Siren, D.O. Primary Care at [facility]  07/07/2019, 9:31 AM

## 2019-07-12 NOTE — Progress Notes (Signed)
Subjective:    Patient ID: Carolyn Morton, female    DOB: January 16, 1951, 69 y.o.   MRN: [id number]  68 y.o.F here for pulmonary eval from PCP Juleen China Hx of severe asthma , two ED visits in Feb  The patient also has a history of sarcoidosis in the 1970s.  She had a liver biopsy that showed granulomas at that time.  She has had scarring in her lungs as well.  She had been on steroids in the past.  She recently moved here from [place] to be closer to her son is undergoing treatment for colon cancer.  She does note increased sinus congestion.  She wakes at night with shortness of breath notes increased wheezing.  She does have the albuterol inhaler and Singulair but she is not able to afford her combination inhalers Breo.  She did take prednisone for 5 days over a week ago and this had some improvement.  05/13/2019 The patient returns today in follow-up after having had pulmonary function studies.  The patient's level of dyspnea is slightly improved on Advair but she still gets dyspneic with exertion and wakens at night with shortness of breath.  There is minimal dry cough.  Pulmonary functions do demonstrate reduction in diffusion capacity significant air trapping and severe peripheral airflow obstruction and moderate central airflow obstruction. Patient notes she had increased cough the last few weeks and is using Tessalon Perles.  She also notes eye irritation with the increased pollen as well.  She also notes increased nasal congestion.  6/21: Patient seen in return follow-up from April and is doing much better.  Patient's dyspnea and cough have improved.  She did not pick up the Patanol eyedrops as they were too expensive.  She is taking Flonase daily along with Zyrtec and is taking the Spiriva and the Advair.  The patient still has a bit of a cough and tends to clear her throat a lot.  She is been using cough drops.  Patient is due up a tetanus vaccine and Prevnar vaccine.  Note she did have a Covid  vaccine the last one was May 13 and was [employer]. The patient does have postnasal drip and will cough sometimes at night.  She takes Benadryl and Zyrtec over-the-counter which seems to help.  Tessalon Perles helped to some degree.   Shortness of Breath This is a chronic problem. The current episode started more than 1 year ago. The problem occurs daily. The problem has been rapidly improving (wiht pred pulse). Associated symptoms include rhinorrhea, a sore throat and sputum production. Pertinent negatives include no abdominal pain, chest pain, ear pain, headaches, hemoptysis, leg pain, leg swelling, orthopnea, PND, rash, swollen glands, vomiting or wheezing. The symptoms are aggravated by animal exposure, emotional upset, weather changes, odors, fumes and pollens (dogs). Associated symptoms comments: Mucus is clear Nose stopped up, eyes burning, sinus pressure on Left side Dry scratchy throat, better with gargle with salt water. Risk factors include smoking. She has tried beta agonist inhalers and oral steroids for the symptoms. The treatment provided significant relief. Her past medical history is significant for asthma and COPD. (Sarcoid)    Past Medical History:  Diagnosis Date  . COPD (chronic obstructive pulmonary disease) ([place])   . Hypertension      Family History  Family history unknown: Yes     Social History   Socioeconomic History  . Marital status: Widowed    Spouse name: Not on file  . Number of children: Not on file  .  Years of education: Not on file  . Highest education level: Not on file  Occupational History  . Not on file  Tobacco Use  . Smoking status: Never Smoker  . Smokeless tobacco: Never Used  Vaping Use  . Vaping Use: Former  Substance and Sexual Activity  . Alcohol use: Not Currently  . Drug use: Never  . Sexual activity: Not on file  Other Topics Concern  . Not on file  Social History Narrative  . Not on file   Social Determinants of Health    Financial Resource Strain:   . Difficulty of Paying Living Expenses:   Food Insecurity:   . Worried About Programme researcher, broadcasting/film/video in the Last Year:   . Barista in the Last Year:   Transportation Needs:   . Freight forwarder (Medical):   Marland Kitchen Lack of Transportation (Non-Medical):   Physical Activity:   . Days of Exercise per Week:   . Minutes of Exercise per Session:   Stress:   . Feeling of Stress :   Social Connections:   . Frequency of Communication with Friends and Family:   . Frequency of Social Gatherings with Friends and Family:   . Attends Religious Services:   . Active Member of Clubs or Organizations:   . Attends Banker Meetings:   Marland Kitchen Marital Status:   Intimate Partner Violence:   . Fear of Current or Ex-Partner:   . Emotionally Abused:   Marland Kitchen Physically Abused:   . Sexually Abused:      No Known Allergies   Outpatient Medications Prior to Visit  Medication Sig Dispense Refill  . albuterol (PROVENTIL) (2.5 MG/3ML) 0.083% nebulizer solution Take 3 mLs (2.5 mg total) by nebulization every 6 (six) hours as needed for wheezing or shortness of breath. 75 mL 0  . albuterol (VENTOLIN HFA) 108 (90 Base) MCG/ACT inhaler INHALE 2 PUFFS INTO THE LUNGS EVERY 6 HOURS AS NEEDED FOR WHEEZING OR SHORTNESS OF BREATH 54 g 1  . DULoxetine (CYMBALTA) 20 MG capsule Take 1 capsule (20 mg total) by mouth daily. 90 capsule 1  . fluticasone (FLONASE) 50 MCG/ACT nasal spray Place 2 sprays into both nostrils daily. 16 g 6  . fluticasone-salmeterol (ADVAIR HFA) 230-21 MCG/ACT inhaler Inhale 2 puffs into the lungs 2 (two) times daily. 1 Inhaler 12  . losartan-hydrochlorothiazide (HYZAAR) 50-12.5 MG tablet Take 1 tablet by mouth daily. 90 tablet 2  . olopatadine (PATANOL) 0.1 % ophthalmic solution Place 1 drop into both eyes 2 (two) times daily. 5 mL 2  . Tiotropium Bromide Monohydrate (SPIRIVA RESPIMAT) 2.5 MCG/ACT AERS Two puff daily 4 g 11  . amoxicillin-clavulanate  (AUGMENTIN) 875-125 MG tablet Take 1 tablet by mouth 2 (two) times daily. 20 tablet 0  . lidocaine (XYLOCAINE) 2 % solution Gargle and spit out 15 mL every 6h prn throat pain. (Patient not taking: Reported on 07/13/2019) 100 mL 0  . loratadine (CLARITIN) 10 MG tablet Take 1 tablet (10 mg total) by mouth daily. (Patient not taking: Reported on 07/13/2019) 30 tablet 4   No facility-administered medications prior to visit.     Review of Systems  HENT: Positive for rhinorrhea and sore throat. Negative for ear pain.   Respiratory: Positive for sputum production and shortness of breath. Negative for hemoptysis and wheezing.   Cardiovascular: Negative for chest pain, orthopnea, leg swelling and PND.  Gastrointestinal: Negative for abdominal pain and vomiting.  Skin: Negative for rash.  Neurological: Negative for  headaches.       Objective:   Physical Exam Vitals:   07/13/19 0901  BP: 138/78  Pulse: 73  Resp: 16  Temp: 98.1 F (36.7 C)  SpO2: 96%  Weight: 201 lb (91.2 kg)  Height: 5' (1.524 m)    Gen: Pleasant, well-nourished, in no distress,  normal affect  ENT: No lesions,  mouth clear,  oropharynx clear, 2+ postnasal drip, nasal turbinate edema without purulence  Neck: No JVD, no TMG, no carotid bruits  Lungs: No use of accessory muscles, no dullness to percussion, distant breath sounds but improved airflow  cardiovascular: RRR, heart sounds normal, no murmur or gallops, no peripheral edema  Abdomen: soft and NT, no HSM,  BS normal  Musculoskeletal: No deformities, no cyanosis or clubbing  Neuro: alert, non focal  Skin: Warm, no lesions or rashes  Chest x-ray from February 2021 shows increased interstitial markings at the bases of the lungs and cardiomegaly PFTS  Ref Range & Units 2 d ago  FVC-Pre L 1.26   FVC-%Pred-Pre % 64   FVC-Post L 1.32   FVC-%Pred-Post % 67   FVC-%Change-Post % 4   FEV1-Pre L 0.86   FEV1-%Pred-Pre % 56   FEV1-Post L 0.92   FEV1-%Pred-Post  % 60   FEV1-%Change-Post % 7   FEV6-Pre L 1.26   FEV6-%Pred-Pre % 67   FEV6-Post L 1.32   FEV6-%Pred-Post % 70   FEV6-%Change-Post % 4   Pre FEV1/FVC ratio % 68   FEV1FVC-%Pred-Pre % 87   Post FEV1/FVC ratio % 69   FEV1FVC-%Change-Post % 2   Pre FEV6/FVC Ratio % 100   FEV6FVC-%Pred-Pre % 104   Post FEV6/FVC ratio % 100   FEV6FVC-%Pred-Post % 104   FEV6FVC-%Change-Post % 0   FEF 25-75 Pre L/sec 0.45   FEF2575-%Pred-Pre % 31   FEF 25-75 Post L/sec 0.60   FEF2575-%Pred-Post % 41   FEF2575-%Change-Post % 32   RV L 3.51   RV % pred % 178   TLC L 5.09   TLC % pred % 114   DLCO unc ml/min/mmHg 10.10   DLCO unc % pred % 59   DL/VA ml/min/mmHg/L 2.99   DL/VA % pred % 88    PHQ9 SCORE ONLY 07/13/2019 05/13/2019 04/07/2019  PHQ-9 Total Score 4 6 1    GAD 7 : Generalized Anxiety Score 07/13/2019 05/13/2019 04/07/2019  Nervous, Anxious, on Edge 1 1 1   Control/stop worrying 1 1 1   Worry too much - different things 1 1 1   Trouble relaxing 1 1 0  Restless 0 0 0  Easily annoyed or irritable 0 0 0  Afraid - awful might happen 1 1 1   Total GAD 7 Score 5 5 4   Anxiety Difficulty - - Somewhat difficult        Assessment & Plan:  I personally reviewed all images and lab data in the [facility] system as well as any outside material available during this office visit and agree with the  radiology impressions.   Centrilobular emphysema (HCC)Gold B with asthma overlap syndrome Gold stage B COPD with overlap syndrome involving asthma  Also upper airway instability and clearing of throat precipitated by reflux  Plan is to continue Advair and Spiriva daily  Patient instructed not to use cough drops but use sugar-free candy drops and turn her self to swallow and not clear the throat she will also continue the antihistamine and the Flonase  Allergic conjunctivitis of both eyes I encouraged the patient to get the  Patanol filled whenever she can afford this  Obesity (BMI 30-39.9) Moderate obesity I  did recommend an exercise program and referral to pulmonary rehab has been made  Depression Elevated PHQ-9 and GAD-7 scores and for this I have recommended the patient follow-up with our licensed clinical social worker and continue the Cymbalta   Diagnoses and all orders for this visit:  Colon cancer screening -     Cologuard  Encounter for screening mammogram for malignant neoplasm of breast -     MM DIGITAL SCREENING BILATERAL; Future  Centrilobular emphysema (HCC)Gold B with asthma overlap syndrome  Depression, unspecified depression type  Allergic conjunctivitis of both eyes  Obesity (BMI 30-39.9)  Need for Tdap vaccination -     Tdap vaccine greater than or equal to 7yo IM  Need for vaccination against Streptococcus pneumoniae -     Pneumococcal conjugate vaccine 13-valent   The patient will also receive a Tdap and Prevnar vaccine at this visit also mammogram was scheduled and a Cologuard was scheduled to try to close her primary care gaps

## 2019-07-13 ENCOUNTER — Encounter: Payer: Self-pay | Admitting: Critical Care Medicine

## 2019-07-13 ENCOUNTER — Ambulatory Visit: Payer: Medicare Other | Attending: Critical Care Medicine | Admitting: Critical Care Medicine

## 2019-07-13 ENCOUNTER — Other Ambulatory Visit: Payer: Self-pay

## 2019-07-13 VITALS — BP 138/78 | HR 73 | Temp 98.1°F | Resp 16 | Ht 60.0 in | Wt 201.0 lb

## 2019-07-13 DIAGNOSIS — Z23 Encounter for immunization: Secondary | ICD-10-CM | POA: Diagnosis not present

## 2019-07-13 DIAGNOSIS — F329 Major depressive disorder, single episode, unspecified: Secondary | ICD-10-CM | POA: Diagnosis not present

## 2019-07-13 DIAGNOSIS — Z1231 Encounter for screening mammogram for malignant neoplasm of breast: Secondary | ICD-10-CM

## 2019-07-13 DIAGNOSIS — F32A Depression, unspecified: Secondary | ICD-10-CM

## 2019-07-13 DIAGNOSIS — Z1211 Encounter for screening for malignant neoplasm of colon: Secondary | ICD-10-CM | POA: Diagnosis not present

## 2019-07-13 DIAGNOSIS — E669 Obesity, unspecified: Secondary | ICD-10-CM

## 2019-07-13 DIAGNOSIS — H1013 Acute atopic conjunctivitis, bilateral: Secondary | ICD-10-CM

## 2019-07-13 DIAGNOSIS — J432 Centrilobular emphysema: Secondary | ICD-10-CM | POA: Diagnosis not present

## 2019-07-13 NOTE — Progress Notes (Signed)
F /u on seasonal allergies

## 2019-07-13 NOTE — Patient Instructions (Signed)
A mammogram will be ordered A cologuard will be ordered A referral to [facility] our behavioral therapy Social worker will be made No change in medications Follow up on pulmonary rehab Use sugar free candy , not cough drops, train self to swallow and not clear throat, practice some voice rest  Increase activity levels, see below    Obesity, Adult Obesity is having too much body fat. Being obese means that your weight is more than what is healthy for you. BMI is a number that explains how much body fat you have. If you have a BMI of 30 or more, you are obese. Obesity is often caused by eating or drinking more calories than your body uses. Changing your lifestyle can help you lose weight. Obesity can cause serious health problems, such as:  Stroke.  Coronary artery disease (CAD).  Type 2 diabetes.  Some types of cancer, including cancers of the colon, breast, uterus, and gallbladder.  Osteoarthritis.  High blood pressure (hypertension).  High cholesterol.  Sleep apnea.  Gallbladder stones.  Infertility problems. What are the causes?  Eating meals each day that are high in calories, sugar, and fat.  Being born with genes that may make you more likely to become obese.  Having a medical condition that causes obesity.  Taking certain medicines.  Sitting a lot (having a sedentary lifestyle).  Not getting enough sleep.  Drinking a lot of drinks that have sugar in them. What increases the risk?  Having a family history of obesity.  Being an [place] American woman.  Being a Hispanic man.  Living in an area with limited access to: ? [place] [facility], recreation centers, or sidewalks. ? Healthy food choices, such as grocery stores and farmers' markets. What are the signs or symptoms? The main sign is having too much body fat. How is this treated?  Treatment for this condition often includes changing your lifestyle. Treatment may include: ? Changing your diet. This may  include making a healthy meal plan. ? Exercise. This may include activity that causes your heart to beat faster (aerobic exercise) and strength training. Work with your doctor to design a program that works for you. ? Medicine to help you lose weight. This may be used if you are not able to lose 1 pound a week after 6 weeks of healthy eating and more exercise. ? Treating conditions that cause the obesity. ? Surgery. Options may include gastric banding and gastric bypass. This may be done if:  Other treatments have not helped to improve your condition.  You have a BMI of 40 or higher.  You have life-threatening health problems related to obesity. Follow these instructions at home: Eating and drinking   Follow advice from your doctor about what to eat and drink. Your doctor may tell you to: ? Limit fast food, sweets, and processed snack foods. ? Choose low-fat options. For example, choose low-fat milk instead of whole milk. ? Eat 5 or more servings of fruits or vegetables each day. ? Eat at home more often. This gives you more control over what you eat. ? Choose healthy foods when you eat out. ? Learn to read food labels. This will help you learn how much food is in 1 serving. ? Keep low-fat snacks available. ? Avoid drinks that have a lot of sugar in them. These include soda, fruit juice, iced tea with sugar, and flavored milk.  Drink enough water to keep your pee (urine) pale yellow.  Do not go on fad  diets. Physical activity  Exercise often, as told by your doctor. Most adults should get up to 150 minutes of moderate-intensity exercise every week.Ask your doctor: ? What types of exercise are safe for you. ? How often you should exercise.  Warm up and stretch before being active.  Do slow stretching after being active (cool down).  Rest between times of being active. Lifestyle  Work with your doctor and a food expert (dietitian) to set a weight-loss goal that is [place] for  you.  Limit your screen time.  Find ways to reward yourself that do not involve food.  Do not drink alcohol if: ? Your doctor tells you not to drink. ? You are pregnant, may be pregnant, or are planning to become pregnant.  If you drink alcohol: ? Limit how much you use to:  0-1 drink a day for women.  0-2 drinks a day for men. ? Be aware of how much alcohol is in your drink. In the [place] one drink equals one 12 oz bottle of beer (355 mL), one 5 oz glass of wine (148 mL), or one 1 oz glass of hard liquor (44 mL). General instructions  Keep a weight-loss journal. This can help you keep track of: ? The food that you eat. ? How much exercise you get.  Take over-the-counter and prescription medicines only as told by your doctor.  Take vitamins and supplements only as told by your doctor.  Think about joining a support group.  Keep all follow-up visits as told by your doctor. This is important. Contact a doctor if:  You cannot meet your weight loss goal after you have changed your diet and lifestyle for 6 weeks. Get help right away if you:  Are having trouble breathing.  Are having thoughts of harming yourself. Summary  Obesity is having too much body fat.  Being obese means that your weight is more than what is healthy for you.  Work with your doctor to set a weight-loss goal.  Get regular exercise as told by your doctor. This information is not intended to replace advice given to you by your health care provider. Make sure you discuss any questions you have with your health care provider. Document Revised: 09/12/2017 Document Reviewed: 09/12/2017 Elsevier Patient Education  2020 [employer].

## 2019-07-13 NOTE — Assessment & Plan Note (Signed)
Elevated PHQ-9 and GAD-7 scores and for this I have recommended the patient follow-up with our licensed clinical social worker and continue the Cymbalta

## 2019-07-13 NOTE — Assessment & Plan Note (Signed)
Moderate obesity I did recommend an exercise program and referral to pulmonary rehab has been made

## 2019-07-13 NOTE — Assessment & Plan Note (Addendum)
Gold stage B COPD with overlap syndrome involving asthma  Also upper airway instability and clearing of throat precipitated by reflux  Plan is to continue Advair and Spiriva daily  Patient instructed not to use cough drops but use sugar-free candy drops and turn her self to swallow and not clear the throat she will also continue the antihistamine and the Flonase

## 2019-07-13 NOTE — Assessment & Plan Note (Signed)
I encouraged the patient to get the Patanol filled whenever she can afford this

## 2019-07-18 ENCOUNTER — Inpatient Hospital Stay (HOSPITAL_COMMUNITY)
Admission: AD | Admit: 2019-07-18 | Discharge: 2019-07-21 | DRG: 419 | Disposition: A | Discharge status: Home / Self Care | Payer: Medicare Other | Attending: Surgery | Admitting: Surgery

## 2019-07-18 ENCOUNTER — Emergency Department (HOSPITAL_COMMUNITY): Payer: Medicare Other

## 2019-07-18 ENCOUNTER — Other Ambulatory Visit: Payer: Self-pay

## 2019-07-18 ENCOUNTER — Encounter (HOSPITAL_COMMUNITY): Payer: Self-pay | Admitting: Emergency Medicine

## 2019-07-18 DIAGNOSIS — F329 Major depressive disorder, single episode, unspecified: Secondary | ICD-10-CM | POA: Diagnosis present

## 2019-07-18 DIAGNOSIS — Z9049 Acquired absence of other specified parts of digestive tract: Secondary | ICD-10-CM | POA: Diagnosis present

## 2019-07-18 DIAGNOSIS — Z79899 Other long term (current) drug therapy: Secondary | ICD-10-CM

## 2019-07-18 DIAGNOSIS — I10 Essential (primary) hypertension: Secondary | ICD-10-CM | POA: Diagnosis present

## 2019-07-18 DIAGNOSIS — K8012 Calculus of gallbladder with acute and chronic cholecystitis without obstruction: Principal | ICD-10-CM | POA: Diagnosis present

## 2019-07-18 DIAGNOSIS — J449 Chronic obstructive pulmonary disease, unspecified: Secondary | ICD-10-CM | POA: Diagnosis present

## 2019-07-18 DIAGNOSIS — Z20822 Contact with and (suspected) exposure to covid-19: Secondary | ICD-10-CM | POA: Diagnosis present

## 2019-07-18 DIAGNOSIS — K819 Cholecystitis, unspecified: Secondary | ICD-10-CM

## 2019-07-18 DIAGNOSIS — Z7951 Long term (current) use of inhaled steroids: Secondary | ICD-10-CM

## 2019-07-18 DIAGNOSIS — R7401 Elevation of levels of liver transaminase levels: Secondary | ICD-10-CM

## 2019-07-18 LAB — URINALYSIS, ROUTINE W REFLEX MICROSCOPIC
Glucose, UA: NEGATIVE mg/dL
Hgb urine dipstick: NEGATIVE
Ketones, ur: NEGATIVE mg/dL
Leukocytes,Ua: NEGATIVE
Nitrite: NEGATIVE
Protein, ur: 30 mg/dL — AB
Specific Gravity, Urine: 1.023 (ref 1.005–1.030)
pH: 5 (ref 5.0–8.0)

## 2019-07-18 LAB — COMPREHENSIVE METABOLIC PANEL
ALT: 890 U/L — ABNORMAL HIGH (ref 0–44)
AST: 775 U/L — ABNORMAL HIGH (ref 15–41)
Albumin: 3.7 g/dL (ref 3.5–5.0)
Alkaline Phosphatase: 327 U/L — ABNORMAL HIGH (ref 38–126)
Anion gap: 13 (ref 5–15)
BUN: 14 mg/dL (ref 8–23)
CO2: 26 mmol/L (ref 22–32)
Calcium: 9.5 mg/dL (ref 8.9–10.3)
Chloride: 102 mmol/L (ref 98–111)
Creatinine, Ser: 0.92 mg/dL (ref 0.44–1.00)
GFR calc Af Amer: >60 mL/min (ref >60)
GFR calc non Af Amer: >60 mL/min (ref >60)
Glucose, Bld: 129 mg/dL — ABNORMAL HIGH (ref 70–99)
Potassium: 4 mmol/L (ref 3.5–5.1)
Sodium: 141 mmol/L (ref 135–145)
Total Bilirubin: 2.7 mg/dL — ABNORMAL HIGH (ref 0.3–1.2)
Total Protein: 7.3 g/dL (ref 6.5–8.1)

## 2019-07-18 LAB — CBC
HCT: 48 % — ABNORMAL HIGH (ref 36.0–46.0)
Hemoglobin: 15.2 g/dL — ABNORMAL HIGH (ref 12.0–15.0)
MCH: 26.7 pg (ref 26.0–34.0)
MCHC: 31.7 g/dL (ref 30.0–36.0)
MCV: 84.4 fL (ref 80.0–100.0)
Platelets: 279 10*3/uL (ref 150–400)
RBC: 5.69 MIL/uL — ABNORMAL HIGH (ref 3.87–5.11)
RDW: 14.6 % (ref 11.5–15.5)
WBC: 10.2 10*3/uL (ref 4.0–10.5)
nRBC: 0 % (ref 0.0–0.2)

## 2019-07-18 LAB — LIPASE, BLOOD: Lipase: 25 U/L (ref 11–51)

## 2019-07-18 LAB — SARS CORONAVIRUS 2 BY RT PCR (HOSPITAL ORDER, PERFORMED IN ~~LOC~~ HOSPITAL LAB): SARS Coronavirus 2: NEGATIVE

## 2019-07-18 MED ORDER — LOSARTAN POTASSIUM 50 MG PO TABS
50.0000 mg | ORAL_TABLET | Freq: Every day | ORAL | Status: DC
  Administered 2019-07-20 – 2019-07-21 (×2): 50 mg via ORAL
  Filled 2019-07-18 (×2): qty 1

## 2019-07-18 MED ORDER — ONDANSETRON HCL 4 MG/2ML IJ SOLN
4.0000 mg | Freq: Four times a day (QID) | INTRAMUSCULAR | Status: DC | PRN
  Filled 2019-07-18: qty 2

## 2019-07-18 MED ORDER — ACETAMINOPHEN 325 MG PO TABS: 650.0000 mg | ORAL_TABLET | Freq: Four times a day (QID) | ORAL | Status: DC | PRN

## 2019-07-18 MED ORDER — IOHEXOL 300 MG/ML  SOLN
100.0000 mL | Freq: Once | INTRAMUSCULAR | Status: AC | PRN
  Administered 2019-07-18: 100 mL via INTRAVENOUS

## 2019-07-18 MED ORDER — MORPHINE SULFATE (PF) 2 MG/ML IV SOLN: 2.0000 mg | INTRAVENOUS | Status: DC | PRN

## 2019-07-18 MED ORDER — UMECLIDINIUM BROMIDE 62.5 MCG/INH IN AEPB
1.0000 | INHALATION_SPRAY | Freq: Every day | RESPIRATORY_TRACT | Status: DC
  Administered 2019-07-19 – 2019-07-21 (×3): 1 via RESPIRATORY_TRACT
  Filled 2019-07-18: qty 7

## 2019-07-18 MED ORDER — MOMETASONE FURO-FORMOTEROL FUM 200-5 MCG/ACT IN AERO
2.0000 | INHALATION_SPRAY | Freq: Two times a day (BID) | RESPIRATORY_TRACT | Status: DC
  Administered 2019-07-19 – 2019-07-21 (×5): 2 via RESPIRATORY_TRACT
  Filled 2019-07-18: qty 8.8

## 2019-07-18 MED ORDER — FENTANYL CITRATE (PF) 100 MCG/2ML IJ SOLN
50.0000 ug | Freq: Once | INTRAMUSCULAR | Status: AC
  Administered 2019-07-18: 50 ug via INTRAVENOUS
  Filled 2019-07-18: qty 2

## 2019-07-18 MED ORDER — ONDANSETRON 4 MG PO TBDP: 4.0000 mg | ORAL_TABLET | Freq: Four times a day (QID) | ORAL | Status: DC | PRN

## 2019-07-18 MED ORDER — SODIUM CHLORIDE 0.9 % IV SOLN
2.0000 g | INTRAVENOUS | Status: DC
  Administered 2019-07-18: 2 g via INTRAVENOUS
  Filled 2019-07-18 (×2): qty 20

## 2019-07-18 MED ORDER — OXYCODONE HCL 5 MG PO TABS
5.0000 mg | ORAL_TABLET | ORAL | Status: DC | PRN
  Administered 2019-07-19 – 2019-07-20 (×4): 10 mg via ORAL
  Filled 2019-07-18 (×4): qty 2

## 2019-07-18 MED ORDER — DULOXETINE HCL 20 MG PO CPEP
20.0000 mg | ORAL_CAPSULE | Freq: Every day | ORAL | Status: DC
  Administered 2019-07-20 – 2019-07-21 (×2): 20 mg via ORAL
  Filled 2019-07-18 (×4): qty 1

## 2019-07-18 MED ORDER — LOSARTAN POTASSIUM-HCTZ 50-12.5 MG PO TABS: 1.0000 | ORAL_TABLET | Freq: Every day | ORAL | Status: DC

## 2019-07-18 MED ORDER — SODIUM CHLORIDE 0.9% FLUSH: 3.0000 mL | Freq: Once | INTRAVENOUS | Status: DC

## 2019-07-18 MED ORDER — ALBUTEROL SULFATE (2.5 MG/3ML) 0.083% IN NEBU: 2.5000 mg | INHALATION_SOLUTION | Freq: Four times a day (QID) | RESPIRATORY_TRACT | Status: DC | PRN

## 2019-07-18 MED ORDER — HYDROCHLOROTHIAZIDE 12.5 MG PO CAPS
12.5000 mg | ORAL_CAPSULE | Freq: Every day | ORAL | Status: DC
  Administered 2019-07-20 – 2019-07-21 (×2): 12.5 mg via ORAL
  Filled 2019-07-18 (×2): qty 1

## 2019-07-18 MED ORDER — ACETAMINOPHEN 650 MG RE SUPP: 650.0000 mg | Freq: Four times a day (QID) | RECTAL | Status: DC | PRN

## 2019-07-18 MED ORDER — SODIUM CHLORIDE 0.9 % IV BOLUS
1000.0000 mL | Freq: Once | INTRAVENOUS | Status: AC
  Administered 2019-07-18: 1000 mL via INTRAVENOUS

## 2019-07-18 MED ORDER — SIMETHICONE 80 MG PO CHEW
40.0000 mg | CHEWABLE_TABLET | Freq: Four times a day (QID) | ORAL | Status: DC | PRN
  Administered 2019-07-19: 40 mg via ORAL
  Filled 2019-07-18: qty 1

## 2019-07-18 MED ORDER — TIOTROPIUM BROMIDE MONOHYDRATE 2.5 MCG/ACT IN AERS: 2.0000 | INHALATION_SPRAY | Freq: Every day | RESPIRATORY_TRACT | Status: DC

## 2019-07-18 MED ORDER — ENOXAPARIN SODIUM 40 MG/0.4ML ~~LOC~~ SOLN
40.0000 mg | SUBCUTANEOUS | Status: DC
  Administered 2019-07-18: 40 mg via SUBCUTANEOUS
  Filled 2019-07-18: qty 0.4

## 2019-07-18 MED ORDER — FLUTICASONE PROPIONATE 50 MCG/ACT NA SUSP
2.0000 | Freq: Every day | NASAL | Status: DC
  Administered 2019-07-20 – 2019-07-21 (×2): 2 via NASAL
  Filled 2019-07-18: qty 16

## 2019-07-18 MED ORDER — SODIUM CHLORIDE 0.9 % IV SOLN: 2.0000 g | Freq: Once | INTRAVENOUS | Status: DC

## 2019-07-18 MED ORDER — ONDANSETRON HCL 4 MG/2ML IJ SOLN
4.0000 mg | Freq: Once | INTRAMUSCULAR | Status: AC
  Administered 2019-07-18: 4 mg via INTRAVENOUS
  Filled 2019-07-18: qty 2

## 2019-07-18 MED ORDER — SODIUM CHLORIDE 0.9 % IV SOLN: INTRAVENOUS | Status: DC

## 2019-07-18 NOTE — H&P (Signed)
Carolyn Morton is an 69 y.o. female.   Chief Complaint: ab pain HPI: 49 yof with copd as new dx and htn presents with ab pain mostly right sided for past 5 days associated with n/v has happened intermittently in past but this is worse.  Not able to eat. Nothing is making it better. Moving and eating make it worse.  No fevers.  She presents to er with elevated lfts and gallstones on imaging.  I was asked to see her.   Past Medical History:  Diagnosis Date  . COPD (chronic obstructive pulmonary disease) ([place])   . Hypertension     History reviewed. No pertinent surgical history.  Family History  Family history unknown: Yes   Social History:  reports that she has never smoked. She has never used smokeless tobacco. She reports previous alcohol use. She reports that she does not use drugs.  Allergies: No Known Allergies  meds reviewed  Results for orders placed or performed during the hospital encounter of 07/18/19 (from the past 48 hour(s))  Lipase, blood     Status: None   Collection Time: 07/18/19 11:10 AM  Result Value Ref Range   Lipase 25 11 - 51 U/L    Comment: Performed at [facility], [place] [address], [place], [place] [address]  Comprehensive metabolic panel     Status: Abnormal   Collection Time: 07/18/19 11:10 AM  Result Value Ref Range   Sodium 141 135 - 145 mmol/L   Potassium 4.0 3.5 - 5.1 mmol/L   Chloride 102 98 - 111 mmol/L   CO2 26 22 - 32 mmol/L   Glucose, Bld 129 (H) 70 - 99 mg/dL    Comment: Glucose reference range applies only to samples taken after fasting for at least 8 hours.   BUN 14 8 - 23 mg/dL   Creatinine, Ser 0.92 0.44 - 1.00 mg/dL   Calcium 9.5 8.9 - 10.3 mg/dL   Total Protein 7.3 6.5 - 8.1 g/dL   Albumin 3.7 3.5 - 5.0 g/dL   AST 775 (H) 15 - 41 U/L   ALT 890 (H) 0 - 44 U/L   Alkaline Phosphatase 327 (H) 38 - 126 U/L   Total Bilirubin 2.7 (H) 0.3 - 1.2 mg/dL   GFR calc non Af Amer >60 >60 mL/min   GFR calc Af Amer >60 >60 mL/min   Anion gap  13 5 - 15    Comment: Performed at [facility], [place] [address] [place], [place] [address]  CBC     Status: Abnormal   Collection Time: 07/18/19 11:10 AM  Result Value Ref Range   WBC 10.2 4.0 - 10.5 K/uL   RBC 5.69 (H) 3.87 - 5.11 MIL/uL   Hemoglobin 15.2 (H) 12.0 - 15.0 g/dL   HCT 48.0 (H) 36 - 46 %   MCV 84.4 80.0 - 100.0 fL   MCH 26.7 26.0 - 34.0 pg   MCHC 31.7 30.0 - 36.0 g/dL   RDW 14.6 11.5 - 15.5 %   Platelets 279 150 - 400 K/uL   nRBC 0.0 0.0 - 0.2 %    Comment: Performed at [facility], [place] [address], [place], [place] [address]  Urinalysis, Routine w reflex microscopic     Status: Abnormal   Collection Time: 07/18/19 11:48 AM  Result Value Ref Range   Color, Urine AMBER (A) YELLOW    Comment: BIOCHEMICALS MAY BE AFFECTED BY COLOR   APPearance HAZY (A) CLEAR  Specific Gravity, Urine 1.023 1.005 - 1.030   pH 5.0 5.0 - 8.0   Glucose, UA NEGATIVE NEGATIVE mg/dL   Hgb urine dipstick NEGATIVE NEGATIVE   Bilirubin Urine SMALL (A) NEGATIVE   Ketones, ur NEGATIVE NEGATIVE mg/dL   Protein, ur 30 (A) NEGATIVE mg/dL   Nitrite NEGATIVE NEGATIVE   Leukocytes,Ua NEGATIVE NEGATIVE   RBC / HPF 0-5 0 - 5 RBC/hpf   WBC, UA 0-5 0 - 5 WBC/hpf   Bacteria, UA RARE (A) NONE SEEN   Squamous Epithelial / LPF 6-10 0 - 5   Mucus PRESENT     Comment: Performed at [facility], [address], [place], [place] [address]   CT ABDOMEN PELVIS W CONTRAST  Result Date: 07/18/2019 CLINICAL DATA:  Diffuse upper abdominal pain for 5 days. Nausea and vomiting. Elevated liver function studies. EXAM: CT ABDOMEN AND PELVIS WITH CONTRAST TECHNIQUE: Multidetector CT imaging of the abdomen and pelvis was performed using the standard protocol following bolus administration of intravenous contrast. CONTRAST:  OMNIPAQUE IOHEXOL 300 MG/ML  SOLN COMPARISON:  None. FINDINGS: Lower chest: Mild scarring and bronchiectasis at both lung bases. No confluent airspace opacity, pleural or  pericardial effusion. Hepatobiliary: The liver is normal in density without suspicious focal abnormality. Mild intra and extrahepatic biliary dilatation. There are probable small nitrogen containing gallstones in the gallbladder neck. No significant gallbladder wall thickening. The common hepatic duct measures up to 12 mm in diameter. No evidence of choledocholithiasis. Pancreas: Unremarkable. No pancreatic ductal dilatation or surrounding inflammatory changes. Spleen: Normal in size without focal abnormality. Adrenals/Urinary Tract: Both adrenal glands appear normal. Allowing for early contrast excretion into the calices, no definite urinary tract calculi. There are probable tiny bilateral renal cysts. No hydronephrosis or perinephric soft tissue stranding. The bladder appears normal. Stomach/Bowel: No evidence of bowel wall thickening, distention or surrounding inflammatory change. The appendix is not seen and may be surgically absent. There are no pericecal inflammatory changes. Vascular/Lymphatic: There are no enlarged abdominal or pelvic lymph nodes. Moderate aortic and branch vessel atherosclerosis. No acute vascular findings. The portal, superior mesenteric and splenic veins are patent. Reproductive: Probable small calcified uterine fundal fibroid. No adnexal mass. Other: Postsurgical changes in the low anterior abdominal wall. No significant hernia or ascites. Musculoskeletal: No acute or significant osseous findings. Extensive multilevel spondylosis. IMPRESSION: 1. Mild intra and extrahepatic biliary dilatation with probable small nitrogen containing gallstones in the gallbladder neck. No visible choledocholithiasis. Given the patient's elevated liver function studies, consider further evaluation with MRCP or ERCP. 2. No other significant findings.  The pancreas appears normal. 3. Aortic Atherosclerosis (ICD10-I70.0). Electronically Signed   By: Carey Bullocks M.D.   On: 07/18/2019 13:17   DG Chest Port  1 View  Result Date: 07/18/2019 CLINICAL DATA:  Upper abdominal pain radiating to upper back since Monday with nausea and vomiting, RIGHT shoulder pain EXAM: PORTABLE CHEST 1 VIEW COMPARISON:  Portable exam 1211 hours compared to 03/01/2019 FINDINGS: Borderline enlargement of cardiac silhouette. Atherosclerotic calcification aorta. Mediastinal contours and pulmonary vascularity normal. Mild bibasilar atelectasis. No acute infiltrate, pleural effusion or pneumothorax. Osseous structures unremarkable. IMPRESSION: Borderline enlargement of cardiac silhouette with bibasilar atelectasis. Electronically Signed   By: Ulyses Southward M.D.   On: 07/18/2019 12:22    Review of Systems  Constitutional: Negative for fever.  Gastrointestinal: Positive for abdominal pain, nausea and vomiting. Negative for constipation.  Genitourinary: Negative for difficulty urinating.  All other systems reviewed and are negative.   Blood  pressure (!) 121/55, pulse 66, temperature 98.4 F (36.9 C), temperature source Oral, resp. rate 13, SpO2 93 %. Physical Exam Constitutional:      Appearance: She is well-developed. She is obese.  HENT:     Head: Normocephalic and atraumatic.  Eyes:     General: No scleral icterus. Cardiovascular:     Rate and Rhythm: Normal rate and regular rhythm.     Heart sounds: Normal heart sounds.  Pulmonary:     Effort: Pulmonary effort is normal.     Breath sounds: Normal breath sounds.  Abdominal:     General: Abdomen is flat. There is no distension.     Palpations: Abdomen is soft. There is no hepatomegaly.     Tenderness: There is abdominal tenderness in the right upper quadrant and epigastric area.     Hernia: No hernia is present.  Skin:    General: Skin is warm and dry.     Capillary Refill: Capillary refill takes less than 2 seconds.  Neurological:     General: No focal deficit present.     Mental Status: She is alert.  Psychiatric:        Mood and Affect: Mood normal.         Behavior: Behavior normal.      Assessment/Plan Biliary colic Possible choledochlithiasis -admit, recheck lfts in am -will check mrcp due to some mild biliary dilatation and the increased lfts -either lap chole tomorrow or ercp pending above COPD restart home meds HTN home meds Lovenox, scds   Emelia Loron, MD 07/18/2019, 4:49 PM

## 2019-07-18 NOTE — ED Notes (Signed)
Urine sent down WITH culture

## 2019-07-18 NOTE — ED Provider Notes (Signed)
[facility] DEPART[facility] Provider Note   CSN: [id number] Arrival date & time: 07/18/19  1031     History Chief Complaint  Patient presents with  . Abdominal Pain    Carolyn Morton is a 69 y.o. female.  HPI    69 yo female ho hypertension, copd presents today complaining of abdomnal pain, nausea and vomiting x 5 days.  Unable to keep po down for several days.  States she tried to drink tea and noodles, but vomited after.  Food makes pain worse.  Urinating not painful but darker urine noted through week.  Bowel movements normal but decreased this week.  No fever or chills.  She reports some similar symptoms greater than 30 years ago and was told she was having stomach spasms.  Past Medical History:  Diagnosis Date  . COPD (chronic obstructive pulmonary disease) ([place])   . Hypertension     Patient Active Problem List   Diagnosis Date Noted  . Obesity (BMI 30-39.9) 07/13/2019  . Depression 05/27/2019  . Centrilobular emphysema (HCC)Gold B with asthma overlap syndrome 05/13/2019  . Allergic conjunctivitis of both eyes 05/13/2019  . Sarcoidosis 04/07/2019  . Essential hypertension 03/16/2019    History reviewed. No pertinent surgical history.   OB History   No obstetric history on file.     Family History  Family history unknown: Yes    Social History   Tobacco Use  . Smoking status: Never Smoker  . Smokeless tobacco: Never Used  Vaping Use  . Vaping Use: Former  Substance Use Topics  . Alcohol use: Not Currently  . Drug use: Never    Home Medications Prior to Admission medications   Medication Sig Start Date End Date Taking? Authorizing Provider  albuterol (PROVENTIL) (2.5 MG/3ML) 0.083% nebulizer solution Take 3 mLs (2.5 mg total) by nebulization every 6 (six) hours as needed for wheezing or shortness of breath. 04/07/19   Elsie Stain, MD  albuterol (VENTOLIN HFA) 108 (90 Base) MCG/ACT inhaler INHALE 2 PUFFS INTO THE LUNGS EVERY  6 HOURS AS NEEDED FOR WHEEZING OR SHORTNESS OF BREATH 04/07/19   Elsie Stain, MD  DULoxetine (CYMBALTA) 20 MG capsule Take 1 capsule (20 mg total) by mouth daily. 05/27/19   Nicolette Bang, DO  fluticasone (FLONASE) 50 MCG/ACT nasal spray Place 2 sprays into both nostrils daily. 04/07/19   Elsie Stain, MD  fluticasone-salmeterol (ADVAIR HFA) [phone] MCG/ACT inhaler Inhale 2 puffs into the lungs 2 (two) times daily. 04/07/19   Elsie Stain, MD  losartan-hydrochlorothiazide (HYZAAR) 50-12.5 MG tablet Take 1 tablet by mouth daily. 04/07/19   Elsie Stain, MD  olopatadine (PATANOL) 0.1 % ophthalmic solution Place 1 drop into both eyes 2 (two) times daily. 05/13/19   Elsie Stain, MD  Tiotropium Bromide Monohydrate (SPIRIVA RESPIMAT) 2.5 MCG/ACT AERS Two puff daily 05/13/19   Elsie Stain, MD    Allergies    Patient has no known allergies.  Review of Systems   Review of Systems  All other systems reviewed and are negative.   Physical Exam Updated Vital Signs BP (!) 136/51 (BP Location: Right Arm)   Pulse 96   Temp 98.4 F (36.9 C) (Oral)   Resp 18   SpO2 94%   Physical Exam Vitals and nursing note reviewed.  Constitutional:      General: She is not in acute distress.    Appearance: She is well-developed. She is obese. She is not ill-appearing.  HENT:  Head: Normocephalic and atraumatic.     Mouth/Throat:     Mouth: Mucous membranes are moist.  Eyes:     Extraocular Movements: Extraocular movements intact.  Cardiovascular:     Rate and Rhythm: Normal rate and regular rhythm.  Abdominal:     General: Bowel sounds are normal. There is distension.     Palpations: Abdomen is soft.     Tenderness: There is abdominal tenderness in the right upper quadrant.  Skin:    General: Skin is warm and dry.     Capillary Refill: Capillary refill takes less than 2 seconds.  Neurological:     General: No focal deficit present.     Mental Status: She is  alert.  Psychiatric:        Mood and Affect: Mood normal.     ED Results / Procedures / Treatments   Labs (all labs ordered are listed, but only abnormal results are displayed) Labs Reviewed  LIPASE, BLOOD  COMPREHENSIVE METABOLIC PANEL  CBC  URINALYSIS, ROUTINE W REFLEX MICROSCOPIC    EKG None  Radiology CT ABDOMEN PELVIS W CONTRAST  Result Date: 07/18/2019 CLINICAL DATA:  Diffuse upper abdominal pain for 5 days. Nausea and vomiting. Elevated liver function studies. EXAM: CT ABDOMEN AND PELVIS WITH CONTRAST TECHNIQUE: Multidetector CT imaging of the abdomen and pelvis was performed using the standard protocol following bolus administration of intravenous contrast. CONTRAST:  OMNIPAQUE IOHEXOL 300 MG/ML  SOLN COMPARISON:  None. FINDINGS: Lower chest: Mild scarring and bronchiectasis at both lung bases. No confluent airspace opacity, pleural or pericardial effusion. Hepatobiliary: The liver is normal in density without suspicious focal abnormality. Mild intra and extrahepatic biliary dilatation. There are probable small nitrogen containing gallstones in the gallbladder neck. No significant gallbladder wall thickening. The common hepatic duct measures up to 12 mm in diameter. No evidence of choledocholithiasis. Pancreas: Unremarkable. No pancreatic ductal dilatation or surrounding inflammatory changes. Spleen: Normal in size without focal abnormality. Adrenals/Urinary Tract: Both adrenal glands appear normal. Allowing for early contrast excretion into the calices, no definite urinary tract calculi. There are probable tiny bilateral renal cysts. No hydronephrosis or perinephric soft tissue stranding. The bladder appears normal. Stomach/Bowel: No evidence of bowel wall thickening, distention or surrounding inflammatory change. The appendix is not seen and may be surgically absent. There are no pericecal inflammatory changes. Vascular/Lymphatic: There are no enlarged abdominal or pelvic lymph  nodes. Moderate aortic and branch vessel atherosclerosis. No acute vascular findings. The portal, superior mesenteric and splenic veins are patent. Reproductive: Probable small calcified uterine fundal fibroid. No adnexal mass. Other: Postsurgical changes in the low anterior abdominal wall. No significant hernia or ascites. Musculoskeletal: No acute or significant osseous findings. Extensive multilevel spondylosis. IMPRESSION: 1. Mild intra and extrahepatic biliary dilatation with probable small nitrogen containing gallstones in the gallbladder neck. No visible choledocholithiasis. Given the patient's elevated liver function studies, consider further evaluation with MRCP or ERCP. 2. No other significant findings.  The pancreas appears normal. 3. Aortic Atherosclerosis (ICD10-I70.0). Electronically Signed   By: Carey Bullocks M.D.   On: 07/18/2019 13:17   DG Chest Port 1 View  Result Date: 07/18/2019 CLINICAL DATA:  Upper abdominal pain radiating to upper back since Monday with nausea and vomiting, RIGHT shoulder pain EXAM: PORTABLE CHEST 1 VIEW COMPARISON:  Portable exam 1211 hours compared to 03/01/2019 FINDINGS: Borderline enlargement of cardiac silhouette. Atherosclerotic calcification aorta. Mediastinal contours and pulmonary vascularity normal. Mild bibasilar atelectasis. No acute infiltrate, pleural effusion or pneumothorax. Osseous structures  unremarkable. IMPRESSION: Borderline enlargement of cardiac silhouette with bibasilar atelectasis. Electronically Signed   By: Ulyses Southward M.D.   On: 07/18/2019 12:22    Procedures Procedures (including critical care time)  Medications Ordered in ED Medications  sodium chloride flush (NS) 0.9 % injection 3 mL (has no administration in time range)  sodium chloride 0.9 % bolus 1,000 mL (has no administration in time range)  fentaNYL (SUBLIMAZE) injection 50 mcg (has no administration in time range)  ondansetron (ZOFRAN) injection 4 mg (has no administration  in time range)    ED Course  I have reviewed the triage vital signs and the nursing notes.  Pertinent labs & imaging results that were available during my care of the patient were reviewed by me and considered in my medical decision making (see chart for details).    MDM Rules/Calculators/A&P                         69 year old female presents today with 5 days of abdominal pain.  Work-up here reveals significant elevation of her transaminases and bilirubin of 2.7.  CT is significant for stones in the gallbladder neck.  Patient given fluids and antiemetics here.  White blood cell count is normal.  Discussed with Dr. Dwain Sarna and he will see and evaluate in ED Rocephin iv ordered Dr. Juleen China aware of patient and will facilitate admission if general surgery declines admission Final Clinical Impression(s) / ED Diagnoses Final diagnoses:  Cholecystitis  Elevated transaminase level    Rx / DC Orders ED Discharge Orders    None       Margarita Grizzle, MD 07/18/19 1606

## 2019-07-18 NOTE — ED Notes (Signed)
Per MRI patient needs to be completely NPO for MRI. Pt reports having nothing to eat or drink since yesterday.

## 2019-07-18 NOTE — ED Notes (Signed)
Pt to MRI at this time.

## 2019-07-18 NOTE — ED Triage Notes (Signed)
C/o upper abd pain that radiates to upper back since Monday with nausea and vomiting.  Denies diarrhea.  Denies urinary complaints.

## 2019-07-19 ENCOUNTER — Inpatient Hospital Stay (HOSPITAL_COMMUNITY): Payer: Medicare Other | Admitting: Anesthesiology

## 2019-07-19 ENCOUNTER — Encounter (HOSPITAL_COMMUNITY): Payer: Self-pay

## 2019-07-19 ENCOUNTER — Encounter (HOSPITAL_COMMUNITY): Admission: AD | Disposition: A | Discharge status: Home / Self Care | Payer: Self-pay | Source: Home / Self Care

## 2019-07-19 HISTORY — PX: CHOLECYSTECTOMY: SHX55

## 2019-07-19 LAB — COMPREHENSIVE METABOLIC PANEL
ALT: 621 U/L — ABNORMAL HIGH (ref 0–44)
AST: 233 U/L — ABNORMAL HIGH (ref 15–41)
Albumin: 3.1 g/dL — ABNORMAL LOW (ref 3.5–5.0)
Alkaline Phosphatase: 277 U/L — ABNORMAL HIGH (ref 38–126)
Anion gap: 9 (ref 5–15)
BUN: 10 mg/dL (ref 8–23)
CO2: 26 mmol/L (ref 22–32)
Calcium: 8.6 mg/dL — ABNORMAL LOW (ref 8.9–10.3)
Chloride: 108 mmol/L (ref 98–111)
Creatinine, Ser: 0.73 mg/dL (ref 0.44–1.00)
GFR calc Af Amer: >60 mL/min (ref >60)
GFR calc non Af Amer: >60 mL/min (ref >60)
Glucose, Bld: 95 mg/dL (ref 70–99)
Potassium: 3.5 mmol/L (ref 3.5–5.1)
Sodium: 143 mmol/L (ref 135–145)
Total Bilirubin: 1.2 mg/dL (ref 0.3–1.2)
Total Protein: 6.2 g/dL — ABNORMAL LOW (ref 6.5–8.1)

## 2019-07-19 LAB — CBC
HCT: 43.7 % (ref 36.0–46.0)
Hemoglobin: 13.4 g/dL (ref 12.0–15.0)
MCH: 26.6 pg (ref 26.0–34.0)
MCHC: 30.7 g/dL (ref 30.0–36.0)
MCV: 86.9 fL (ref 80.0–100.0)
Platelets: 228 10*3/uL (ref 150–400)
RBC: 5.03 MIL/uL (ref 3.87–5.11)
RDW: 14.8 % (ref 11.5–15.5)
WBC: 8 10*3/uL (ref 4.0–10.5)
nRBC: 0 % (ref 0.0–0.2)

## 2019-07-19 SURGERY — LAPAROSCOPIC CHOLECYSTECTOMY WITH INTRAOPERATIVE CHOLANGIOGRAM
Anesthesia: General | Site: Abdomen

## 2019-07-19 MED ORDER — AMISULPRIDE (ANTIEMETIC) 5 MG/2ML IV SOLN
10.0000 mg | Freq: Once | INTRAVENOUS | Status: AC | PRN
  Administered 2019-07-19: 10 mg via INTRAVENOUS

## 2019-07-19 MED ORDER — DIPHENHYDRAMINE HCL 50 MG/ML IJ SOLN: 12.5000 mg | Freq: Four times a day (QID) | INTRAMUSCULAR | Status: DC | PRN

## 2019-07-19 MED ORDER — BUPIVACAINE-EPINEPHRINE 0.25% -1:200000 IJ SOLN
INTRAMUSCULAR | Status: DC | PRN
  Administered 2019-07-19: 30 mL

## 2019-07-19 MED ORDER — ACETAMINOPHEN 500 MG PO TABS
500.0000 mg | ORAL_TABLET | ORAL | Status: AC
  Administered 2019-07-19: 500 mg via ORAL
  Filled 2019-07-19: qty 1

## 2019-07-19 MED ORDER — FENTANYL CITRATE (PF) 100 MCG/2ML IJ SOLN: 25.0000 ug | INTRAMUSCULAR | Status: DC | PRN

## 2019-07-19 MED ORDER — LACTATED RINGERS IV SOLN: INTRAVENOUS | Status: DC

## 2019-07-19 MED ORDER — INDOCYANINE GREEN 25 MG IV SOLR
INTRAVENOUS | Status: DC | PRN
  Administered 2019-07-19: 2.5 mg via INTRAVENOUS

## 2019-07-19 MED ORDER — SUGAMMADEX SODIUM 200 MG/2ML IV SOLN
INTRAVENOUS | Status: DC | PRN
  Administered 2019-07-19: 200 mg via INTRAVENOUS

## 2019-07-19 MED ORDER — PROPOFOL 10 MG/ML IV BOLUS
INTRAVENOUS | Status: AC
  Filled 2019-07-19: qty 20

## 2019-07-19 MED ORDER — ONDANSETRON HCL 4 MG/2ML IJ SOLN
INTRAMUSCULAR | Status: DC | PRN
  Administered 2019-07-19: 4 mg via INTRAVENOUS

## 2019-07-19 MED ORDER — SODIUM CHLORIDE 0.9 % IV SOLN
2.0000 g | INTRAVENOUS | Status: AC
  Administered 2019-07-19: 2 g via INTRAVENOUS
  Filled 2019-07-19: qty 20

## 2019-07-19 MED ORDER — SODIUM CHLORIDE 0.9 % IR SOLN
Status: DC | PRN
  Administered 2019-07-19: 1000 mL

## 2019-07-19 MED ORDER — DIPHENHYDRAMINE HCL 12.5 MG/5ML PO ELIX: 12.5000 mg | ORAL_SOLUTION | Freq: Four times a day (QID) | ORAL | Status: DC | PRN

## 2019-07-19 MED ORDER — MORPHINE SULFATE (PF) 2 MG/ML IV SOLN: 1.0000 mg | INTRAVENOUS | Status: DC | PRN

## 2019-07-19 MED ORDER — INDOCYANINE GREEN 25 MG IV SOLR
7.5000 mg | INTRAVENOUS | Status: DC
  Filled 2019-07-19: qty 10

## 2019-07-19 MED ORDER — ENOXAPARIN SODIUM 40 MG/0.4ML ~~LOC~~ SOLN
40.0000 mg | SUBCUTANEOUS | Status: DC
  Administered 2019-07-19 – 2019-07-20 (×2): 40 mg via SUBCUTANEOUS
  Filled 2019-07-19 (×2): qty 0.4

## 2019-07-19 MED ORDER — PANTOPRAZOLE SODIUM 40 MG IV SOLR
40.0000 mg | Freq: Every day | INTRAVENOUS | Status: DC
  Administered 2019-07-19: 40 mg via INTRAVENOUS
  Filled 2019-07-19: qty 40

## 2019-07-19 MED ORDER — GABAPENTIN 100 MG PO CAPS
100.0000 mg | ORAL_CAPSULE | Freq: Two times a day (BID) | ORAL | Status: DC
  Administered 2019-07-19 – 2019-07-21 (×4): 100 mg via ORAL
  Filled 2019-07-19 (×4): qty 1

## 2019-07-19 MED ORDER — FENTANYL CITRATE (PF) 250 MCG/5ML IJ SOLN
INTRAMUSCULAR | Status: AC
  Filled 2019-07-19: qty 5

## 2019-07-19 MED ORDER — PHENYLEPHRINE HCL (PRESSORS) 10 MG/ML IV SOLN
INTRAVENOUS | Status: DC | PRN
  Administered 2019-07-19 (×2): 80 ug via INTRAVENOUS

## 2019-07-19 MED ORDER — LIDOCAINE 2% (20 MG/ML) 5 ML SYRINGE
INTRAMUSCULAR | Status: DC | PRN
  Administered 2019-07-19: 40 mg via INTRAVENOUS

## 2019-07-19 MED ORDER — CHLORHEXIDINE GLUCONATE 0.12 % MT SOLN
OROMUCOSAL | Status: AC
  Administered 2019-07-19: 15 mL via OROMUCOSAL
  Filled 2019-07-19: qty 15

## 2019-07-19 MED ORDER — GABAPENTIN 100 MG PO CAPS
100.0000 mg | ORAL_CAPSULE | ORAL | Status: AC
  Administered 2019-07-19: 100 mg via ORAL
  Filled 2019-07-19: qty 1

## 2019-07-19 MED ORDER — AMISULPRIDE (ANTIEMETIC) 5 MG/2ML IV SOLN
INTRAVENOUS | Status: AC
  Filled 2019-07-19: qty 4

## 2019-07-19 MED ORDER — DOCUSATE SODIUM 100 MG PO CAPS
100.0000 mg | ORAL_CAPSULE | Freq: Two times a day (BID) | ORAL | Status: DC
  Administered 2019-07-19 – 2019-07-21 (×4): 100 mg via ORAL
  Filled 2019-07-19 (×4): qty 1

## 2019-07-19 MED ORDER — KETOROLAC TROMETHAMINE 30 MG/ML IJ SOLN
INTRAMUSCULAR | Status: DC | PRN
  Administered 2019-07-19: 15 mg via INTRAVENOUS

## 2019-07-19 MED ORDER — LACTATED RINGERS IV SOLN: INTRAVENOUS | Status: DC | PRN

## 2019-07-19 MED ORDER — ZOLPIDEM TARTRATE 5 MG PO TABS: 5.0000 mg | ORAL_TABLET | Freq: Every evening | ORAL | Status: DC | PRN

## 2019-07-19 MED ORDER — ROCURONIUM BROMIDE 10 MG/ML (PF) SYRINGE
PREFILLED_SYRINGE | INTRAVENOUS | Status: DC | PRN
  Administered 2019-07-19: 50 mg via INTRAVENOUS

## 2019-07-19 MED ORDER — PROPOFOL 10 MG/ML IV BOLUS
INTRAVENOUS | Status: DC | PRN
  Administered 2019-07-19: 150 mg via INTRAVENOUS

## 2019-07-19 MED ORDER — DEXAMETHASONE SODIUM PHOSPHATE 10 MG/ML IJ SOLN
INTRAMUSCULAR | Status: DC | PRN
  Administered 2019-07-19: 5 mg via INTRAVENOUS

## 2019-07-19 MED ORDER — 0.9 % SODIUM CHLORIDE (POUR BTL) OPTIME
TOPICAL | Status: DC | PRN
  Administered 2019-07-19: 1000 mL

## 2019-07-19 MED ORDER — BUPIVACAINE-EPINEPHRINE (PF) 0.25% -1:200000 IJ SOLN
INTRAMUSCULAR | Status: AC
  Filled 2019-07-19: qty 30

## 2019-07-19 MED ORDER — CHLORHEXIDINE GLUCONATE 0.12 % MT SOLN: 15.0000 mL | Freq: Once | OROMUCOSAL | Status: AC

## 2019-07-19 MED ORDER — FENTANYL CITRATE (PF) 100 MCG/2ML IJ SOLN
INTRAMUSCULAR | Status: DC | PRN
  Administered 2019-07-19: 50 ug via INTRAVENOUS
  Administered 2019-07-19: 100 ug via INTRAVENOUS
  Administered 2019-07-19 (×2): 50 ug via INTRAVENOUS

## 2019-07-19 MED ORDER — ACETAMINOPHEN 500 MG PO TABS
1000.0000 mg | ORAL_TABLET | Freq: Three times a day (TID) | ORAL | Status: DC
  Administered 2019-07-19 – 2019-07-20 (×4): 1000 mg via ORAL
  Filled 2019-07-19 (×5): qty 2

## 2019-07-19 SURGICAL SUPPLY — 51 items
APPLIER CLIP 5 13 M/L LIGAMAX5 (MISCELLANEOUS) ×2
BENZOIN TINCTURE PRP APPL 2/3 (GAUZE/BANDAGES/DRESSINGS) IMPLANT
BIOPATCH RED 1 DISK 7.0 (GAUZE/BANDAGES/DRESSINGS) ×2 IMPLANT
BLADE CLIPPER SURG (BLADE) IMPLANT
BNDG ADH 1X3 SHEER STRL LF (GAUZE/BANDAGES/DRESSINGS) ×2 IMPLANT
CANISTER SUCT 3000ML PPV (MISCELLANEOUS) ×2 IMPLANT
CHLORAPREP W/TINT 26 (MISCELLANEOUS) ×2 IMPLANT
CLIP APPLIE 5 13 M/L LIGAMAX5 (MISCELLANEOUS) ×1 IMPLANT
COVER MAYO STAND STRL (DRAPES) IMPLANT
COVER SURGICAL LIGHT HANDLE (MISCELLANEOUS) ×2 IMPLANT
COVER WAND RF STERILE (DRAPES) ×2 IMPLANT
DRAIN CHANNEL 19F RND (DRAIN) ×2 IMPLANT
DRAPE C-ARM 42X120 X-RAY (DRAPES) IMPLANT
DRSG TEGADERM 2-3/8X2-3/4 SM (GAUZE/BANDAGES/DRESSINGS) ×6 IMPLANT
DRSG TEGADERM 4X4.75 (GAUZE/BANDAGES/DRESSINGS) ×4 IMPLANT
ELECT REM PT RETURN 9FT ADLT (ELECTROSURGICAL) ×2
ELECTRODE REM PT RTRN 9FT ADLT (ELECTROSURGICAL) ×1 IMPLANT
ENDOLOOP SUT PDS II  0 18 (SUTURE) ×2
ENDOLOOP SUT PDS II 0 18 (SUTURE) ×2 IMPLANT
EVACUATOR SILICONE 100CC (DRAIN) ×2 IMPLANT
GAUZE SPONGE 2X2 8PLY STRL LF (GAUZE/BANDAGES/DRESSINGS) ×1 IMPLANT
GLOVE BIOGEL M STRL SZ7.5 (GLOVE) ×2 IMPLANT
GLOVE INDICATOR 8.0 STRL GRN (GLOVE) ×4 IMPLANT
GOWN STRL REUS W/ TWL LRG LVL3 (GOWN DISPOSABLE) ×3 IMPLANT
GOWN STRL REUS W/TWL 2XL LVL3 (GOWN DISPOSABLE) ×2 IMPLANT
GOWN STRL REUS W/TWL LRG LVL3 (GOWN DISPOSABLE) ×3
GRASPER SUT TROCAR 14GX15 (MISCELLANEOUS) IMPLANT
KIT BASIN OR (CUSTOM PROCEDURE TRAY) ×2 IMPLANT
KIT TURNOVER KIT B (KITS) ×2 IMPLANT
NS IRRIG 1000ML POUR BTL (IV SOLUTION) ×2 IMPLANT
PAD ARMBOARD 7.5X6 YLW CONV (MISCELLANEOUS) ×2 IMPLANT
POUCH RETRIEVAL ECOSAC 10 (ENDOMECHANICALS) ×1 IMPLANT
POUCH RETRIEVAL ECOSAC 10MM (ENDOMECHANICALS) ×1
SCISSORS LAP 5X35 DISP (ENDOMECHANICALS) ×2 IMPLANT
SET CHOLANGIOGRAPH 5 50 .035 (SET/KITS/TRAYS/PACK) IMPLANT
SET IRRIG TUBING LAPAROSCOPIC (IRRIGATION / IRRIGATOR) ×2 IMPLANT
SET TUBE SMOKE EVAC HIGH FLOW (TUBING) ×2 IMPLANT
SLEEVE ENDOPATH XCEL 5M (ENDOMECHANICALS) ×4 IMPLANT
SPECIMEN JAR SMALL (MISCELLANEOUS) ×2 IMPLANT
SPONGE GAUZE 2X2 STER 10/PKG (GAUZE/BANDAGES/DRESSINGS) ×1
STRIP CLOSURE SKIN 1/2X4 (GAUZE/BANDAGES/DRESSINGS) IMPLANT
SUT ETHILON 2 0 FS 18 (SUTURE) ×2 IMPLANT
SUT MNCRL AB 4-0 PS2 18 (SUTURE) ×2 IMPLANT
SUT VIC AB 0 UR5 27 (SUTURE) IMPLANT
SUT VICRYL 0 UR6 27IN ABS (SUTURE) IMPLANT
TOWEL GREEN STERILE (TOWEL DISPOSABLE) ×2 IMPLANT
TOWEL GREEN STERILE FF (TOWEL DISPOSABLE) ×2 IMPLANT
TRAY LAPAROSCOPIC MC (CUSTOM PROCEDURE TRAY) ×2 IMPLANT
TROCAR XCEL BLUNT TIP 100MML (ENDOMECHANICALS) ×4 IMPLANT
TROCAR XCEL NON-BLD 5MMX100MML (ENDOMECHANICALS) ×2 IMPLANT
WATER STERILE IRR 1000ML POUR (IV SOLUTION) ×2 IMPLANT

## 2019-07-19 NOTE — Op Note (Signed)
Carolyn Morton [id number] September 11, 1950 07/19/2019  Laparoscopic Cholecystectomy  Procedure Note  Indications: This patient presents with symptomatic gallbladder disease and will undergo laparoscopic cholecystectomy.  Pre-operative Diagnosis: Calculus of gallbladder with acute cholecystitis, without mention of obstruction  Post-operative Diagnosis: Same  Surgeon: Gaynelle Adu MD FACS  Assistants: Berenda Morale RNFA  Anesthesia: General endotracheal anesthesia  Procedure Details  The patient was seen again in the Holding Room. The risks, benefits, complications, treatment options, and expected outcomes were discussed with the patient. The possibilities of reaction to medication, pulmonary aspiration, perforation of viscus, bleeding, recurrent infection, finding a normal gallbladder, the need for additional procedures, failure to diagnose a condition, the possible need to convert to an open procedure, and creating a complication requiring transfusion or operation were discussed with the patient. The likelihood of improving the patient's symptoms with return to their baseline status is good.  The patient and/or family concurred with the proposed plan, giving informed consent. The site of surgery properly noted. The patient was taken to Operating Room, identified as Carolyn Morton and the procedure verified as Laparoscopic Cholecystectomy with Intraoperative Cholangiogram. A Time Out was held and the above information confirmed. Antibiotic prophylaxis had been administered.  He was given oral Tylenol and gabapentin preprocedure.  She was also given ICG dye   General endotracheal anesthesia was then administered and tolerated well. After the induction, the abdomen was prepped with Chloraprep and draped in the sterile fashion. The patient was positioned in the supine position.  Local anesthetic agent was injected into the skin near the umbilicus and an incision made. We dissected down to the abdominal  fascia with blunt dissection.  The fascia was incised vertically and we entered the peritoneal cavity bluntly.  A pursestring suture of 0-Vicryl was placed around the fascial opening.  The Hasson cannula was inserted and secured with the stay suture.  Pneumoperitoneum was then created with CO2 and tolerated well without any adverse changes in the patient's vital signs. An 5-mm port was placed in the subxiphoid position.  Two 5-mm ports were placed in the right upper quadrant. All skin incisions were infiltrated with a local anesthetic agent before making the incision and placing the trocars.   We positioned the patient in reverse Trendelenburg, tilted slightly to the patient's left.  The gallbladder was identified.  It was distended and thick-walled.  I aspirated it to facilitate retraction.  The fundus grasped and retracted cephalad. Adhesions were lysed bluntly and with the electrocautery where indicated, taking care not to injure any adjacent organs or viscus. The infundibulum was grasped and retracted laterally, exposing the peritoneum overlying the triangle of Calot.  There is a fair amount of inflammation in this area.  I was able to identify the node Calot.  Using hook electrocautery I took down lateral and medial attachments of the peritoneum around the gallbladder.  Using a [employer] I took some posterior lateral dissection behind the infundibulum.  We then switched views and identified the ICG dye in the gallbladder liver but it never opacified the cystic duct or the common bile duct.  Peritoneum over the triangle of Calot this was then divided and exposed in a blunt fashion. A critical view of the cystic duct and cystic artery was obtained.  There is a small anterior cystic artery branch and a more prominent posterior branch of the cystic artery.  The cystic duct junction with the infundibulum was clearly identified and bluntly dissected circumferentially.  I could not perform a cholangiogram  as  the cystic duct junction with imaging was very dilated and would not accommodate a clip.  I continue to mobilize the more distal gallbladder with hook electrocautery.  This enabled [place] to achieve a very large critical view of safety.  There were no other structures entering the gallbladder.  I could clearly identify the junction of the cystic duct and infundibulum along with the small anterior cystic artery branch as well as a larger posterior cystic artery branch coming into the gallbladder.  There were no other structures entering the gallbladder.  I ligated both branches of the cystic artery with clips and cauterized distally on the gallbladder.  I decided to transect the gallbladder at the level of the infundibulum leaving a small cuff.  There are some gallstones spillage.  I placed a Vicryl Endoloop around the specimen side of the gallbladder to prevent ongoing some spillage.  I then placed 2 PDS Endoloops around the small cuff of infundibulum and the cystic duct stump.  The gallbladder was dissected from the liver bed in retrograde fashion with the electrocautery. The gallbladder and spilled stones were removed and placed in an Ecco sac.  The gallbladder and Ecco sac were then removed through the umbilical port site. The liver bed was irrigated and inspected. Hemostasis was achieved with the electrocautery. Copious irrigation was utilized and was repeatedly aspirated until clear.  I decided to leave a 19 round Blake drain because of the PDS Endoloops around the cystic duct stump.  It was brought out through the right lateral trocar site secured to the skin with a 2-0 nylon suture.  the pursestring suture was used to close the umbilical fascia.  I did place an additional interrupted 0 Vicryl at the umbilical fascia with a PMI suture passer using laparoscopic guidance because of the current obesity.  Additional local with infiltrated with serial  We again inspected the right upper quadrant for hemostasis.   The umbilical closure was inspected and there was no air leak and nothing trapped within the closure. Pneumoperitoneum was released as we removed the trocars.  4-0 Monocryl was used to close the skin.   Benzoin, steri-strips, and clean dressings were applied. The patient was then extubated and brought to the recovery room in stable condition. Instrument, sponge, and needle counts were correct at closure and at the conclusion of the case.   Findings: Cholecystitis with Cholelithiasis  Estimated Blood Loss: Minimal         Drains: 19 round in ruq         Specimens: Gallbladder           Complications: None; patient tolerated the procedure well.         Disposition: PACU - hemodynamically stable.         Condition: stable  Carolyn Ruff. Redmond Pulling, MD, FACS General, Bariatric, & Minimally Invasive Surgery [facility], [place]

## 2019-07-19 NOTE — Anesthesia Preprocedure Evaluation (Signed)
Anesthesia Evaluation  Patient identified by MRN, date of birth, ID band Patient awake    Reviewed: Allergy & Precautions, NPO status , Patient's Chart, lab work & pertinent test results  Airway Mallampati: II  TM Distance: >3 FB Neck ROM: Full    Dental  (+) Dental Advisory Given   Pulmonary COPD,    breath sounds clear to auscultation       Cardiovascular hypertension, Pt. on medications  Rhythm:Regular Rate:Normal     Neuro/Psych negative neurological ROS     GI/Hepatic negative GI ROS, Neg liver ROS,   Endo/Other  negative endocrine ROS  Renal/GU negative Renal ROS     Musculoskeletal   Abdominal   Peds  Hematology negative hematology ROS (+)   Anesthesia Other Findings   Reproductive/Obstetrics                             Lab Results  Component Value Date   WBC 8.0 07/19/2019   HGB 13.4 07/19/2019   HCT 43.7 07/19/2019   MCV 86.9 07/19/2019   PLT 228 07/19/2019   Lab Results  Component Value Date   CREATININE 0.73 07/19/2019   BUN 10 07/19/2019   NA 143 07/19/2019   K 3.5 07/19/2019   CL 108 07/19/2019   CO2 26 07/19/2019    Anesthesia Physical Anesthesia Plan  ASA: III  Anesthesia Plan: General   Post-op Pain Management:    Induction: Intravenous  PONV Risk Score and Plan: 3 and Dexamethasone, Ondansetron and Treatment may vary due to age or medical condition  Airway Management Planned: Oral ETT  Additional Equipment: None  Intra-op Plan:   Post-operative Plan: Extubation in OR  Informed Consent: I have reviewed the patients History and Physical, chart, labs and discussed the procedure including the risks, benefits and alternatives for the proposed anesthesia with the patient or authorized representative who has indicated his/her understanding and acceptance.     Dental advisory given  Plan Discussed with: CRNA  Anesthesia Plan Comments:          Anesthesia Quick Evaluation

## 2019-07-19 NOTE — Anesthesia Postprocedure Evaluation (Signed)
Anesthesia Post Note  Patient: Carolyn Morton  Procedure(s) Performed: LAPAROSCOPIC CHOLECYSTECTOMY WITH INTRAOPERATIVE CHOLANGIOGRAM (N/A Abdomen)     Patient location during evaluation: PACU Anesthesia Type: General Level of consciousness: awake and alert Pain management: pain level controlled Vital Signs Assessment: post-procedure vital signs reviewed and stable Respiratory status: spontaneous breathing, nonlabored ventilation, respiratory function stable and patient connected to nasal cannula oxygen Cardiovascular status: blood pressure returned to baseline and stable Postop Assessment: no apparent nausea or vomiting Anesthetic complications: no   No complications documented.  Last Vitals:  Vitals:   07/19/19 1240 07/19/19 1300  BP: 123/66 (!) 147/77  Pulse: 69 73  Resp: (!) 21   Temp: 36.7 C 36.5 C  SpO2: 93% 96%    Last Pain:  Vitals:   07/19/19 1240  TempSrc:   PainSc: 3                  Kennieth Rad

## 2019-07-19 NOTE — Transfer of Care (Signed)
Immediate Anesthesia Transfer of Care Note  Patient: Carolyn Morton  Procedure(s) Performed: LAPAROSCOPIC CHOLECYSTECTOMY WITH INTRAOPERATIVE CHOLANGIOGRAM (N/A Abdomen)  Patient Location: PACU  Anesthesia Type:General  Level of Consciousness: awake, alert  and oriented  Airway & Oxygen Therapy: Patient Spontanous Breathing and Patient connected to face mask oxygen  Post-op Assessment: Report given to RN, Post -op Vital signs reviewed and stable and Patient moving all extremities X 4  Post vital signs: Reviewed and stable  Last Vitals:  Vitals Value Taken Time  BP 122/57 07/19/19 1214  Temp    Pulse 65 07/19/19 1214  Resp 21 07/19/19 1214  SpO2 96 % 07/19/19 1214  Vitals shown include unvalidated device data.  Last Pain:  Vitals:   07/19/19 0735  TempSrc:   PainSc: 0-No pain         Complications: No complications documented.

## 2019-07-19 NOTE — Progress Notes (Signed)
Acute Care Surgery Service Progress Note:    Chief Complaint/Subjective: Has HA this am which is bothering her more than anything  Objective: Vital signs in last 24 hours: Temp:  [98 F (36.7 C)-98.4 F (36.9 C)] 98 F (36.7 C) (06/27 0541) Pulse Rate:  [66-96] 72 (06/27 0541) Resp:  [13-20] 18 (06/27 0541) BP: (106-136)/(45-57) 116/57 (06/27 0541) SpO2:  [90 %-97 %] 97 % (06/27 0541) Last BM Date: 07/17/19  Intake/Output from previous day: 06/26 0701 - 06/27 0700 In: 1751 [I.V.:651; IV Piggyback:1100] Out: -  Intake/Output this shift: No intake/output data recorded.  Lungs: cta, nonlabored  Cardiovascular: reg  Abd: obese, soft, RUQ ttp  Extremities: no edema, +SCDs  Neuro: alert, nonfocal  Lab Results: CBC  Recent Labs    07/18/19 1110 07/19/19 0335  WBC 10.2 8.0  HGB 15.2* 13.4  HCT 48.0* 43.7  PLT 279 228   BMET Recent Labs    07/18/19 1110 07/19/19 0335  NA 141 143  K 4.0 3.5  CL 102 108  CO2 26 26  GLUCOSE 129* 95  BUN 14 10  CREATININE 0.92 0.73  CALCIUM 9.5 8.6*   LFT Hepatic Function Latest Ref Rng & Units 07/19/2019 07/18/2019  Total Protein 6.5 - 8.1 g/dL 6.2(L) 7.3  Albumin 3.5 - 5.0 g/dL 3.1(L) 3.7  AST 15 - 41 U/L 233(H) 775(H)  ALT 0 - 44 U/L 621(H) 890(H)  Alk Phosphatase 38 - 126 U/L 277(H) 327(H)  Total Bilirubin 0.3 - 1.2 mg/dL 1.2 2.7(H)   PT/INR No results for input(s): LABPROT, INR in the last 72 hours. ABG No results for input(s): PHART, HCO3 in the last 72 hours.  Invalid input(s): PCO2, PO2  Studies/Results:  Anti-infectives: Anti-infectives (From admission, onward)   Start     Dose/Rate Route Frequency Ordered Stop   07/18/19 1930  cefTRIAXone (ROCEPHIN) 2 g in sodium chloride 0.9 % 100 mL IVPB     Discontinue     2 g 200 mL/hr over 30 Minutes Intravenous Every 24 hours 07/18/19 1929     07/18/19 1615  cefTRIAXone (ROCEPHIN) 2 g in sodium chloride 0.9 % 100 mL IVPB  Status:  Discontinued        2 g 200  mL/hr over 30 Minutes Intravenous  Once 07/18/19 1603 07/18/19 1938      Medications: Scheduled Meds:  acetaminophen  500 mg Oral On Call to OR   DULoxetine  20 mg Oral Daily   enoxaparin (LOVENOX) injection  40 mg Subcutaneous Q24H   fluticasone  2 spray Each Nare Daily   gabapentin  100 mg Oral On Call to OR   losartan  50 mg Oral Daily   And   hydrochlorothiazide  12.5 mg Oral Daily   indocyanine green  7.5 mg Intravenous To OR   mometasone-formoterol  2 puff Inhalation BID   sodium chloride flush  3 mL Intravenous Once   umeclidinium bromide  1 puff Inhalation Daily   Continuous Infusions:  sodium chloride 75 mL/hr at 07/18/19 1953   cefTRIAXone (ROCEPHIN)  IV 2 g (07/18/19 2002)   PRN Meds:.albuterol, morphine injection, ondansetron **OR** ondansetron (ZOFRAN) IV, oxyCODONE, simethicone  Assessment/Plan: Patient Active Problem List   Diagnosis Date Noted   Cholecystitis 07/18/2019   Obesity (BMI 30-39.9) 07/13/2019   Depression 05/27/2019   Centrilobular emphysema (HCC)Gold B with asthma overlap syndrome 05/13/2019   Allergic conjunctivitis of both eyes 05/13/2019   Sarcoidosis 04/07/2019   Essential hypertension 03/16/2019   s/p Procedure(s):  Cholecystitis Elevated LFTs  MRI negative for CBD stone LFTs trending down  Will plan to proceed with lap chole with ioc today  I believe the patient's symptoms are consistent with gallbladder disease.  We discussed gallbladder disease.   I discussed laparoscopic cholecystectomy with IOC in detail.  The patient was shown diagrams detailing the procedure.  We discussed the risks and benefits of a laparoscopic cholecystectomy including, but not limited to bleeding, infection, injury to surrounding structures such as the intestine or liver, bile leak, retained gallstones, need to convert to an open procedure, prolonged diarrhea, blood clots such as  DVT, common bile duct injury, anesthesia risks, and  possible need for additional procedures.  We discussed the typical post-operative recovery course. I explained that the likelihood of improvement of their symptoms is good.  ERAS med On scheduled IV abx All questions asked and answered  Disposition:  LOS: 1 day    Leighton Ruff. Redmond Pulling, MD, FACS General, Bariatric, & Minimally Invasive Surgery [phone] [facility]

## 2019-07-19 NOTE — Anesthesia Procedure Notes (Signed)
Procedure Name: Intubation Date/Time: 07/19/2019 10:12 AM Performed by: Neldon Newport, CRNA Pre-anesthesia Checklist: Timeout performed, Patient being monitored, Suction available, Emergency Drugs available and Patient identified Patient Re-evaluated:Patient Re-evaluated prior to induction Oxygen Delivery Method: Circle system utilized Preoxygenation: Pre-oxygenation with 100% oxygen Induction Type: IV induction Ventilation: Mask ventilation without difficulty Laryngoscope Size: Mac and 3 Grade View: Grade II Tube type: Oral Tube size: 7.0 mm Number of attempts: 1 Placement Confirmation: ETT inserted through vocal cords under direct vision,  positive ETCO2 and breath sounds checked- equal and bilateral Secured at: 21 cm Tube secured with: Tape Dental Injury: Teeth and Oropharynx as per pre-operative assessment

## 2019-07-20 ENCOUNTER — Encounter (HOSPITAL_COMMUNITY): Payer: Self-pay | Admitting: General Surgery

## 2019-07-20 LAB — COMPREHENSIVE METABOLIC PANEL
ALT: 437 U/L — ABNORMAL HIGH (ref 0–44)
AST: 97 U/L — ABNORMAL HIGH (ref 15–41)
Albumin: 3.1 g/dL — ABNORMAL LOW (ref 3.5–5.0)
Alkaline Phosphatase: 214 U/L — ABNORMAL HIGH (ref 38–126)
Anion gap: 10 (ref 5–15)
BUN: 8 mg/dL (ref 8–23)
CO2: 25 mmol/L (ref 22–32)
Calcium: 8.6 mg/dL — ABNORMAL LOW (ref 8.9–10.3)
Chloride: 106 mmol/L (ref 98–111)
Creatinine, Ser: 0.84 mg/dL (ref 0.44–1.00)
GFR calc Af Amer: >60 mL/min (ref >60)
GFR calc non Af Amer: >60 mL/min (ref >60)
Glucose, Bld: 153 mg/dL — ABNORMAL HIGH (ref 70–99)
Potassium: 3.6 mmol/L (ref 3.5–5.1)
Sodium: 141 mmol/L (ref 135–145)
Total Bilirubin: 0.9 mg/dL (ref 0.3–1.2)
Total Protein: 6.2 g/dL — ABNORMAL LOW (ref 6.5–8.1)

## 2019-07-20 LAB — CBC
HCT: 42.6 % (ref 36.0–46.0)
Hemoglobin: 13.2 g/dL (ref 12.0–15.0)
MCH: 27.3 pg (ref 26.0–34.0)
MCHC: 31 g/dL (ref 30.0–36.0)
MCV: 88.2 fL (ref 80.0–100.0)
Platelets: 231 10*3/uL (ref 150–400)
RBC: 4.83 MIL/uL (ref 3.87–5.11)
RDW: 15.1 % (ref 11.5–15.5)
WBC: 9.9 10*3/uL (ref 4.0–10.5)
nRBC: 0 % (ref 0.0–0.2)

## 2019-07-20 LAB — MAGNESIUM: Magnesium: 1.7 mg/dL (ref 1.7–2.4)

## 2019-07-20 MED ORDER — PANTOPRAZOLE SODIUM 40 MG PO TBEC
40.0000 mg | DELAYED_RELEASE_TABLET | Freq: Every day | ORAL | Status: DC
  Administered 2019-07-20: 40 mg via ORAL
  Filled 2019-07-20: qty 1

## 2019-07-20 NOTE — Progress Notes (Signed)
1 Day Post-Op  Subjective: CC: Doing well. Tolerating cld without n/v. Notes she is a little "queezy" but not nauseated. Feels a little distended in her upper abdomen. Soreness around incisions and RUQ that she rates as a 3/10 with oral medications. Mobilizing in the room. Urinating but notes it is darker than normal.   Objective: Vital signs in last 24 hours: Temp:  [97.7 F (36.5 C)-99.7 F (37.6 C)] 98.6 F (37 C) (06/28 0535) Pulse Rate:  [68-84] 71 (06/28 0535) Resp:  [15-21] 17 (06/28 0535) BP: (115-147)/(57-82) 135/66 (06/28 0535) SpO2:  [90 %-96 %] 90 % (06/28 0535) Last BM Date: 07/17/19  Intake/Output from previous day: 06/27 0701 - 06/28 0700 In: 2350.4 [P.O.:584; I.V.:1766.4] Out: 370 [Urine:300; Drains:50; Blood:20] Intake/Output this shift: No intake/output data recorded.  PE: Gen:  Alert, NAD, pleasant Pulm: normal rate and effort.  Abd: Soft, mild distension of the upper abdomen. Appropriately tender in the RUQ and around laparoscopic incision sites. Otherwise NT. Tegaderm dressings in place. Umbilical with some dried blood but no signs of active bleeding. Remaining Tegaderms clean and dry. Drain with bloody SS output. 50cc/24 hours (since placement).  Ext:  No LE edema  Psych: A&Ox3  Skin: no rashes noted, warm and dry   Lab Results:  Recent Labs    07/19/19 0335 07/20/19 0237  WBC 8.0 9.9  HGB 13.4 13.2  HCT 43.7 42.6  PLT 228 231   BMET Recent Labs    07/19/19 0335 07/20/19 0237  NA 143 141  K 3.5 3.6  CL 108 106  CO2 26 25  GLUCOSE 95 153*  BUN 10 8  CREATININE 0.73 0.84  CALCIUM 8.6* 8.6*   PT/INR No results for input(s): LABPROT, INR in the last 72 hours. CMP     Component Value Date/Time   NA 141 07/20/2019 0237   K 3.6 07/20/2019 0237   CL 106 07/20/2019 0237   CO2 25 07/20/2019 0237   GLUCOSE 153 (H) 07/20/2019 0237   BUN 8 07/20/2019 0237   CREATININE 0.84 07/20/2019 0237   CALCIUM 8.6 (L) 07/20/2019 0237   PROT 6.2  (L) 07/20/2019 0237   ALBUMIN 3.1 (L) 07/20/2019 0237   AST 97 (H) 07/20/2019 0237   ALT 437 (H) 07/20/2019 0237   ALKPHOS 214 (H) 07/20/2019 0237   BILITOT 0.9 07/20/2019 0237   GFRNONAA >60 07/20/2019 0237   GFRAA >60 07/20/2019 0237   Lipase     Component Value Date/Time   LIPASE 25 07/18/2019 1110       Studies/Results: CT ABDOMEN PELVIS W CONTRAST  Result Date: 07/18/2019 CLINICAL DATA:  Diffuse upper abdominal pain for 5 days. Nausea and vomiting. Elevated liver function studies. EXAM: CT ABDOMEN AND PELVIS WITH CONTRAST TECHNIQUE: Multidetector CT imaging of the abdomen and pelvis was performed using the standard protocol following bolus administration of intravenous contrast. CONTRAST:  143mL OMNIPAQUE IOHEXOL 300 MG/ML  SOLN COMPARISON:  None. FINDINGS: Lower chest: Mild scarring and bronchiectasis at both lung bases. No confluent airspace opacity, pleural or pericardial effusion. Hepatobiliary: The liver is normal in density without suspicious focal abnormality. Mild intra and extrahepatic biliary dilatation. There are probable small nitrogen containing gallstones in the gallbladder neck. No significant gallbladder wall thickening. The common hepatic duct measures up to 12 mm in diameter. No evidence of choledocholithiasis. Pancreas: Unremarkable. No pancreatic ductal dilatation or surrounding inflammatory changes. Spleen: Normal in size without focal abnormality. Adrenals/Urinary Tract: Both adrenal glands appear normal. Allowing for early  contrast excretion into the calices, no definite urinary tract calculi. There are probable tiny bilateral renal cysts. No hydronephrosis or perinephric soft tissue stranding. The bladder appears normal. Stomach/Bowel: No evidence of bowel wall thickening, distention or surrounding inflammatory change. The appendix is not seen and may be surgically absent. There are no pericecal inflammatory changes. Vascular/Lymphatic: There are no enlarged  abdominal or pelvic lymph nodes. Moderate aortic and branch vessel atherosclerosis. No acute vascular findings. The portal, superior mesenteric and splenic veins are patent. Reproductive: Probable small calcified uterine fundal fibroid. No adnexal mass. Other: Postsurgical changes in the low anterior abdominal wall. No significant hernia or ascites. Musculoskeletal: No acute or significant osseous findings. Extensive multilevel spondylosis. IMPRESSION: 1. Mild intra and extrahepatic biliary dilatation with probable small nitrogen containing gallstones in the gallbladder neck. No visible choledocholithiasis. Given the patient's elevated liver function studies, consider further evaluation with MRCP or ERCP. 2. No other significant findings.  The pancreas appears normal. 3. Aortic Atherosclerosis (ICD10-I70.0). Electronically Signed   By: Carey Bullocks M.D.   On: 07/18/2019 13:17   MR ABDOMEN MRCP WO CONTRAST  Result Date: 07/18/2019 CLINICAL DATA:  Right upper quadrant pain 5 days. Nausea and vomiting. Elevated liver function tests. Biliary ductal dilatation on recent CT. EXAM: MRI ABDOMEN WITHOUT CONTRAST  (INCLUDING MRCP) TECHNIQUE: Multiplanar multisequence MR imaging of the abdomen was performed. Heavily T2-weighted images of the biliary and pancreatic ducts were obtained, and three-dimensional MRCP images were rendered by post processing. COMPARISON:  CT on 07/18/2019 FINDINGS: Lower chest: No acute findings. Hepatobiliary: No masses visualized on this unenhanced exam. Numerous tiny gallstones are seen nearly completely filling the gallbladder. Although there is no significant gallbladder wall thickening, mild pericholecystic edema and trace fluid is suspicious for acute cholecystitis. Mild diffuse biliary ductal dilatation is seen, with common bile duct measuring 11 mm. However, no calculi are seen within common bile duct. Pancreas: No evidence of pancreatic mass or inflammatory changes. No evidence of  pancreatic ductal dilatation or pancreas divisum. Spleen:  Within normal limits in size. Adrenals/Urinary tract: Unremarkable. No evidence of nephrolithiasis or hydronephrosis. Stomach/Bowel: Visualized portion unremarkable. Vascular/Lymphatic: No pathologically enlarged lymph nodes identified. No evidence of abdominal aortic aneurysm. Other:  None. Musculoskeletal:  No suspicious bone lesions identified. IMPRESSION: 1. Cholelithiasis, and findings suspicious for acute cholecystitis. 2. Mild diffuse biliary ductal dilatation, without definite evidence of choledocholithiasis. Electronically Signed   By: Danae Orleans M.D.   On: 07/18/2019 18:30   DG Chest Port 1 View  Result Date: 07/18/2019 CLINICAL DATA:  Upper abdominal pain radiating to upper back since Monday with nausea and vomiting, RIGHT shoulder pain EXAM: PORTABLE CHEST 1 VIEW COMPARISON:  Portable exam 1211 hours compared to 03/01/2019 FINDINGS: Borderline enlargement of cardiac silhouette. Atherosclerotic calcification aorta. Mediastinal contours and pulmonary vascularity normal. Mild bibasilar atelectasis. No acute infiltrate, pleural effusion or pneumothorax. Osseous structures unremarkable. IMPRESSION: Borderline enlargement of cardiac silhouette with bibasilar atelectasis. Electronically Signed   By: Ulyses Southward M.D.   On: 07/18/2019 12:22    Anti-infectives: Anti-infectives (From admission, onward)   Start     Dose/Rate Route Frequency Ordered Stop   07/19/19 1930  cefTRIAXone (ROCEPHIN) 2 g in sodium chloride 0.9 % 100 mL IVPB        2 g 200 mL/hr over 30 Minutes Intravenous Every 24 hours 07/19/19 1306 07/19/19 2031   07/18/19 1930  cefTRIAXone (ROCEPHIN) 2 g in sodium chloride 0.9 % 100 mL IVPB  Status:  Discontinued  2 g 200 mL/hr over 30 Minutes Intravenous Every 24 hours 07/18/19 1929 07/19/19 1306   07/18/19 1615  cefTRIAXone (ROCEPHIN) 2 g in sodium chloride 0.9 % 100 mL IVPB  Status:  Discontinued        2 g 200 mL/hr  over 30 Minutes Intravenous  Once 07/18/19 1603 07/18/19 1938       Assessment/Plan COPD restart home meds HTN home meds  Cholecystitis with Cholelithiasis - s/p Lap Chole, endoloop around cystic duct, Dr. Andrey Campanile, 07/19/2019 - POD #1 - Adv diet. Recheck later today for possible d/c home. She plans to stay with her son - Maintain drain. She reports she knows how to manage this at home as she is an [employer] fundraiser - Mobilize - Pulm toilet   FEN - HH VTE - SCDs, Lovenox ID - Rocephin peri-op. None currently. WBC 9.9  Plan: Recheck later today for possible d/c home    LOS: 2 days    Jacinto Halim , [facility] Surgery 07/20/2019, 9:25 AM Please see Amion for pager number during day hours 7:00am-4:30pm

## 2019-07-20 NOTE — Discharge Instructions (Signed)
[facility]  Please arrive at least 30 min before your appointment to complete your check in paperwork.  If you are unable to arrive 30 min prior to your appointment time we may have to cancel or reschedule you. LAPAROSCOPIC SURGERY: POST OP INSTRUCTIONS Always review your discharge instruction sheet given to you by the facility where your surgery was performed. IF YOU HAVE DISABILITY OR FAMILY LEAVE FORMS, YOU MUST BRING THEM TO THE OFFICE FOR PROCESSING.   DO NOT GIVE THEM TO YOUR DOCTOR.  PAIN CONTROL  1. First take acetaminophen (Tylenol) AND/or ibuprofen (Advil) to control your pain after surgery.  Follow directions on package.  Taking acetaminophen (Tylenol) and/or ibuprofen (Advil) regularly after surgery will help to control your pain and lower the amount of prescription pain medication you may need.  You should not take more than 4,000 mg (4 grams) of acetaminophen (Tylenol) in 24 hours.  You should not take ibuprofen (Advil), aleve, motrin, naprosyn or other NSAIDS if you have a history of stomach ulcers or chronic kidney disease.  2. A prescription for pain medication may be given to you upon discharge.  Take your pain medication as prescribed, if you still have uncontrolled pain after taking acetaminophen (Tylenol) or ibuprofen (Advil). 3. Use ice packs to help control pain. 4. If you need a refill on your pain medication, please contact your pharmacy.  They will contact our office to request authorization. Prescriptions will not be filled after 5pm or on week-ends.  HOME MEDICATIONS 5. Take your usually prescribed medications unless otherwise directed.  DIET 6. You should follow a light diet the first few days after arrival home.  Be sure to include lots of fluids daily. Avoid fatty, fried foods.   CONSTIPATION 7. It is common to experience some constipation after surgery and if you are taking pain medication.  Increasing fluid intake and taking a stool  softener (such as Colace) will usually help or prevent this problem from occurring.  A mild laxative (Milk of Magnesia or Miralax) should be taken according to package instructions if there are no bowel movements after 48 hours.  WOUND/INCISION CARE 8. Most patients will experience some swelling and bruising in the area of the incisions.  Ice packs will help.  Swelling and bruising can take several days to resolve.  9. Unless discharge instructions indicate otherwise, follow guidelines below  a. STERI-STRIPS - you may remove your outer bandages 48 hours after surgery, and you may shower at that time.  You have steri-strips (small skin tapes) in place directly over the incision.  These strips should be left on the skin for 7-10 days.   b. DERMABOND/SKIN GLUE - you may shower in 24 hours.  The glue will flake off over the next 2-3 weeks. 10. Any sutures or staples will be removed at the office during your follow-up visit.  ACTIVITIES 11. You may resume regular (light) daily activities beginning the next day--such as daily self-care, walking, climbing stairs--gradually increasing activities as tolerated.  You may have sexual intercourse when it is comfortable.  Refrain from any heavy lifting or straining until approved by your doctor. a. You may drive when you are no longer taking prescription pain medication, you can comfortably wear a seatbelt, and you can safely maneuver your car and apply brakes.  FOLLOW-UP 12. You should see your doctor in the office for a follow-up appointment approximately 2-3 weeks after your surgery.  You should have been given your post-op/follow-up appointment when   your surgery was scheduled.  If you did not receive a post-op/follow-up appointment, make sure that you call for this appointment within a day or two after you arrive home to insure a convenient appointment time.   WHEN TO CALL YOUR DOCTOR: 1. Fever over 101.0 2. Inability to urinate 3. Continued bleeding from  incision. 4. Increased pain, redness, or drainage from the incision. 5. Increasing abdominal pain  The clinic staff is available to answer your questions during regular business hours.  Please don't hesitate to call and ask to speak to one of the nurses for clinical concerns.  If you have a medical emergency, go to the nearest emergency room or call 911.  A surgeon from [facility] is always on call at the hospital. [address], [address], [place], [place]  [address] ? [address] [address], [place], [place]   [address] [phone] ? [phone] ? FAX [phone]   Gallbladder Eating Plan If you have a gallbladder condition, you may have trouble digesting fats. Eating a low-fat diet can help reduce your symptoms, and may be helpful before and after having surgery to remove your gallbladder (cholecystectomy). Your health care provider may recommend that you work with a diet and nutrition specialist (dietitian) to help you reduce the amount of fat in your diet. What are tips for following this plan? General guidelines  Limit your fat intake to less than 30% of your total daily calories. If you eat around 1,800 calories each day, this is less than 60 grams (g) of fat per day.  Fat is an important part of a healthy diet. Eating a low-fat diet can make it hard to maintain a healthy body weight. Ask your dietitian how much fat, calories, and other nutrients you need each day.  Eat small, frequent meals throughout the day instead of three large meals.  Drink at least 8-10 cups of fluid a day. Drink enough fluid to keep your urine clear or pale yellow.  Limit alcohol intake to no more than 1 drink a day for nonpregnant women and 2 drinks a day for men. One drink equals 12 oz of beer, 5 oz of wine, or 1 oz of hard liquor. Reading food labels  Check Nutrition Facts on food labels for the amount of fat per serving. Choose foods with less than 3 grams of fat per  serving. Shopping  Choose nonfat and low-fat healthy foods. Look for the words "nonfat," "low fat," or "fat free."  Avoid buying processed or prepackaged foods. Cooking  Cook using low-fat methods, such as baking, broiling, grilling, or boiling.  Cook with small amounts of healthy fats, such as olive oil, grapeseed oil, canola oil, or sunflower oil. What foods are recommended?   All fresh, frozen, or canned fruits and vegetables.  Whole grains.  Low-fat or non-fat (skim) milk and yogurt.  Lean meat, skinless poultry, fish, eggs, and beans.  Low-fat protein supplement powders or drinks.  Spices and herbs. What foods are not recommended?  High-fat foods. These include baked goods, fast food, fatty cuts of meat, ice cream, french toast, sweet rolls, pizza, cheese bread, foods covered with butter, creamy sauces, or cheese.  Fried foods. These include french fries, tempura, battered fish, breaded chicken, fried breads, and sweets.  Foods with strong odors.  Foods that cause bloating and gas. Summary  A low-fat diet can be helpful if you have a gallbladder condition, or before and after gallbladder surgery.  Limit your fat intake to less   than 30% of your total daily calories. This is about 60 g of fat if you eat 1,800 calories each day.  Eat small, frequent meals throughout the day instead of three large meals. This information is not intended to replace advice given to you by your health care provider. Make sure you discuss any questions you have with your health care provider. Document Revised: 05/01/2018 Document Reviewed: 02/16/2016 Elsevier Patient Education  2020 Elsevier Inc.   Surgical Drain Home Care Surgical drains are used to remove extra fluid that normally builds up in a surgical wound after surgery. A surgical drain helps to heal a surgical wound. Different kinds of surgical drains include:  Active drains. These drains use suction to pull drainage away from  the surgical wound. Drainage flows through a tube to a container outside of the body. With these drains, you need to keep the bulb or the drainage container flat (compressed) at all times, except while you empty it. Flattening the bulb or container creates suction.  Passive drains. These drains allow fluid to drain naturally, by gravity. Drainage flows through a tube to a bandage (dressing) or a container outside of the body. Passive drains do not need to be emptied. A drain is placed during surgery. Right after surgery, drainage is usually bright red and a little thicker than water. The drainage may gradually turn yellow or pink and become thinner. It is likely that your health care provider will remove the drain when the drainage stops or when the amount decreases to 1-2 Tbsp (15-30 mL) during a 24-hour period. Supplies needed:  Tape.  Germ-free cleaning solution (sterile saline).  Cotton swabs.  Split gauze drain sponge: 4 x 4 inches (10 x 10 cm).  Gauze square: 4 x 4 inches (10 x 10 cm). How to care for your surgical drain Care for your drain as told by your health care provider. This is important to help prevent infection. If your drain is placed at your back, or any other hard-to-reach area, ask another person to assist you in performing the following tasks: General care  Keep the skin around the drain dry and covered with a dressing at all times.  Check your drain area every day for signs of infection. Check for: ? Redness, swelling, or pain. ? Pus or a bad smell. ? Cloudy drainage. ? Tenderness or pressure at the drain exit site. Changing the dressing Follow instructions from your health care provider about how to change your dressing. Change your dressing at least once a day. Change it more often if needed to keep the dressing dry. Make sure you: 1. Gather your supplies. 2. Wash your hands with soap and water before you change your dressing. If soap and water are not available,  use hand sanitizer. 3. Remove the old dressing. Avoid using scissors to do that. 4. Wash your hands with soap and water again after removing the old dressing. 5. Use sterile saline to clean your skin around the drain. You may need to use a cotton swab to clean the skin. 6. Place the tube through the slit in a drain sponge. Place the drain sponge so that it covers your wound. 7. Place the gauze square or another drain sponge on top of the drain sponge that is on the wound. Make sure the tube is between those layers. 8. Tape the dressing to your skin. 9. Tape the drainage tube to your skin 1-2 inches (2.5-5 cm) below the place where the tube enters your   body. Taping keeps the tube from pulling on any stitches (sutures) that you have. 10. Wash your hands with soap and water. 11. Write down the color of your drainage and how often you change your dressing. How to empty your active drain  1. Make sure that you have a measuring cup that you can empty your drainage into. 2. Wash your hands with soap and water. If soap and water are not available, use hand sanitizer. 3. Loosen any pins or clips that hold the tube in place. 4. If your health care provider tells you to strip the tube to prevent clots and tube blockages: ? Hold the tube at the skin with one hand. Use your other hand to pinch the tubing with your thumb and first finger. ? Gently move your fingers down the tube while squeezing very lightly. This clears any drainage, clots, or tissue from the tube. ? You may need to do this several times each day to keep the tube clear. Do not pull on the tube. 5. Open the bulb cap or the drain plug. Do not touch the inside of the cap or the bottom of the plug. 6. Turn the device upside down and gently squeeze. 7. Empty all of the drainage into the measuring cup. 8. Compress the bulb or the container and replace the cap or the plug. To compress the bulb or the container, squeeze it firmly in the middle while  you close the cap or plug the container. 9. Write down the amount of drainage that you have in each 24-hour period. If you have less than 2 Tbsp (30 mL) of drainage during 24 hours, contact your health care provider. 10. Flush the drainage down the toilet. 11. Wash your hands with soap and water. Contact a health care provider if:  You have redness, swelling, or pain around your drain area.  You have pus or a bad smell coming from your drain area.  You have a fever or chills.  The skin around your drain is warm to the touch.  The amount of drainage that you have is increasing instead of decreasing.  You have drainage that is cloudy.  There is a sudden stop or a sudden decrease in the amount of drainage that you have.  Your drain tube falls out.  Your active drain does not stay compressed after you empty it. Summary  Surgical drains are used to remove extra fluid that normally builds up in a surgical wound after surgery.  Different kinds of surgical drains include active drains and passive drains. Active drains use suction to pull drainage away from the surgical wound, and passive drains allow fluid to drain naturally.  It is important to care for your drain to prevent infection. If your drain is placed at your back, or any other hard-to-reach area, ask another person to assist you.  Contact your health care provider if you have redness, swelling, or pain around your drain area. This information is not intended to replace advice given to you by your health care provider. Make sure you discuss any questions you have with your health care provider. Document Revised: 02/12/2018 Document Reviewed: 02/12/2018 Elsevier Patient Education  2020 Elsevier Inc.  

## 2019-07-20 NOTE — Progress Notes (Signed)
PHARMACIST - PHYSICIAN COMMUNICATION  DR:   Andrey Campanile  CONCERNING: IV to Oral Route Change Policy  RECOMMENDATION: This patient is receiving pantoprazole by the intravenous route.  Based on criteria approved by the Pharmacy and Therapeutics Committee, the intravenous medication(s) is/are being converted to the equivalent oral dose form(s).   DESCRIPTION: These criteria include:  The patient is eating (either orally or via tube) and/or has been taking other orally administered medications for a least 24 hours  The patient has no evidence of active gastrointestinal bleeding or impaired GI absorption (gastrectomy, short bowel, patient on TNA or NPO).  If you have questions about this conversion, please contact the Pharmacy Department  []   [phone] )  ( [phone] []   [phone] )  [facility] [x]   [phone] )  [facility] []   [phone] )  [facility] []   [phone] )  [facility]   Brigid Vandekamp A. ( [phone], PharmD, BCPS, FNKF Clinical Pharmacist [place] Please utilize Amion for appropriate phone number to reach the unit pharmacist [facility])   07/20/2019 12:04 PM

## 2019-07-21 LAB — SURGICAL PATHOLOGY

## 2019-07-21 MED ORDER — ACETAMINOPHEN 500 MG PO TABS: 1000.0000 mg | ORAL_TABLET | Freq: Three times a day (TID) | ORAL | 0 refills | Status: AC | PRN

## 2019-07-21 MED ORDER — OXYCODONE HCL 5 MG PO TABS: 5.0000 mg | ORAL_TABLET | Freq: Four times a day (QID) | ORAL | 0 refills | Status: DC | PRN

## 2019-07-21 MED ORDER — DOCUSATE SODIUM 100 MG PO CAPS: 100.0000 mg | ORAL_CAPSULE | Freq: Two times a day (BID) | ORAL | 0 refills | Status: DC | PRN

## 2019-07-21 MED ORDER — ONDANSETRON 4 MG PO TBDP: 4.0000 mg | ORAL_TABLET | Freq: Four times a day (QID) | ORAL | 0 refills | Status: DC | PRN

## 2019-07-21 NOTE — Plan of Care (Signed)
  Problem: Activity: Goal: Risk for activity intolerance will decrease 07/21/2019 1030 by Coralyn Pear, RN Outcome: Adequate for Discharge 07/21/2019 1030 by Coralyn Pear, RN Outcome: Adequate for Discharge   Problem: Safety: Goal: Ability to remain free from injury will improve 07/21/2019 1030 by Coralyn Pear, RN Outcome: Adequate for Discharge 07/21/2019 1030 by Coralyn Pear, RN Outcome: Adequate for Discharge   Problem: Skin Integrity: Goal: Risk for impaired skin integrity will decrease 07/21/2019 1030 by Coralyn Pear, RN Outcome: Adequate for Discharge 07/21/2019 1030 by Coralyn Pear, RN Outcome: Adequate for Discharge

## 2019-07-21 NOTE — Plan of Care (Signed)
  Problem: Activity: Goal: Risk for activity intolerance will decrease Outcome: Progressing   Problem: Safety: Goal: Ability to remain free from injury will improve Outcome: Progressing   Problem: Skin Integrity: Goal: Risk for impaired skin integrity will decrease Outcome: Progressing   

## 2019-07-21 NOTE — Discharge Summary (Signed)
Patient ID: Carolyn Morton [id number] Nov 25, 1950 69 y.o.  Admit date: 07/18/2019 Discharge date: 07/21/2019  Admitting Diagnosis: Biliary colic Possible choledochlithiasis  Discharge Diagnosis Patient Active Problem List   Diagnosis Date Noted   Cholecystitis 07/18/2019   Obesity (BMI 30-39.9) 07/13/2019   Depression 05/27/2019   Centrilobular emphysema (HCC)Gold B with asthma overlap syndrome 05/13/2019   Allergic conjunctivitis of both eyes 05/13/2019   Sarcoidosis 04/07/2019   Essential hypertension 03/16/2019    Consultants None   H&P: 73 yof with copd as new dx and htn presents with ab pain mostly right sided for past 5 days associated with n/v has happened intermittently in past but this is worse.  Not able to eat. Nothing is making it better. Moving and eating make it worse.  No fevers.  She presents to er with elevated lfts and gallstones on imaging.  I was asked to see her.   Procedures Dr. Andrey Campanile - Laparoscopic Cholecystectomy - 07/19/2019  Hospital Course:  Patient was admitted to the general surgery service. MRCP was obtained 2/2 elevated LFT's. MRCP was negative for CBD stone. Patient was taken to the OR on 6/27 as noted above. She tolerated the procedure well. An IOC was not able to be performed as the cystic duct junction with imaging was very dilated and would not accommodate a clip. A Endoloop was used to tie off cystic duct. Drain was left in place. Repeat labs in the AM with downtrending LFT's and normalize t bili. On POD 2, the patient was voiding well, tolerating diet, ambulating well, pain well controlled, vital signs stable, incisions c/d/i, drain SS and felt stable for discharge home. Follow up appointment as noted below.   Physical Exam: Gen:  Alert, NAD, pleasant Pulm: normal rate and effort.  Abd: Soft, ND. Appropriately tender in the RUQ and around laparoscopic incision sites. Otherwise NT. Tegaderm dressings in place. Umbilical with some  dried blood but no signs of active bleeding. Remaining Tegaderms clean and dry. Drain with bloody SS output. Ext:  No LE edema  Psych: A&Ox3  Skin: no rashes noted, warm and dry  Allergies as of 07/21/2019   No Known Allergies     Medication List    STOP taking these medications   olopatadine 0.1 % ophthalmic solution Commonly known as: Patanol     TAKE these medications   acetaminophen 500 MG tablet Commonly known as: TYLENOL Take 2 tablets (1,000 mg total) by mouth every 8 (eight) hours as needed.   Advair HFA 230-21 MCG/ACT inhaler Generic drug: fluticasone-salmeterol Inhale 2 puffs into the lungs 2 (two) times daily.   albuterol (2.5 MG/3ML) 0.083% nebulizer solution Commonly known as: PROVENTIL Take 3 mLs (2.5 mg total) by nebulization every 6 (six) hours as needed for wheezing or shortness of breath. What changed: Another medication with the same name was changed. Make sure you understand how and when to take each.   albuterol 108 (90 Base) MCG/ACT inhaler Commonly known as: VENTOLIN HFA INHALE 2 PUFFS INTO THE LUNGS EVERY 6 HOURS AS NEEDED FOR WHEEZING OR SHORTNESS OF BREATH What changed: See the new instructions.   aspirin 81 MG chewable tablet Chew 81 mg by mouth as needed (for general health).   docusate sodium 100 MG capsule Commonly known as: COLACE Take 1 capsule (100 mg total) by mouth 2 (two) times daily as needed for mild constipation.   DULoxetine 20 MG capsule Commonly known as: Cymbalta Take 1 capsule (20 mg total) by mouth daily.  fluticasone 50 MCG/ACT nasal spray Commonly known as: FLONASE Place 2 sprays into both nostrils daily.   losartan-hydrochlorothiazide 50-12.5 MG tablet Commonly known as: HYZAAR Take 1 tablet by mouth daily.   naphazoline-pheniramine 0.025-0.3 % ophthalmic solution Commonly known as: NAPHCON-A Place 1 drop into both eyes daily as needed for eye irritation or allergies.   ondansetron 4 MG disintegrating  tablet Commonly known as: ZOFRAN-ODT Take 1 tablet (4 mg total) by mouth every 6 (six) hours as needed for nausea.   oxyCODONE 5 MG immediate release tablet Commonly known as: Oxy IR/ROXICODONE Take 1 tablet (5 mg total) by mouth every 6 (six) hours as needed for severe pain.   Spiriva Respimat 2.5 MCG/ACT Aers Generic drug: Tiotropium Bromide Monohydrate Two puff daily What changed:   how much to take  how to take this  when to take this         Follow-up Information    [facility], [facility] [place]. Go on 08/11/2019.   Specialty: General Surgery Why: 830am. Please arrive 30 minutes prior to your appointment for paperwork. Please bring a copy of your photo ID and insurance card.  Contact information: [address] [place] [address] [phone]        [facility] [place]. Go on 07/24/2019.   Specialty: General Surgery Why: 10am. For a nurse visit for a drain check. Please arrive 30 minutes prior to your appointment for paperwork.  Contact information: [address] [place] [address] [phone]              Signed: Leary Roca, [facility] 07/21/2019, 9:57 AM Please see Amion for pager number during day hours 7:00am-4:30pm

## 2019-07-21 NOTE — Care Management Important Message (Signed)
Important Message  Patient Details  Name: Carolyn Morton MRN: [id number] Date of Birth: [birth year]   Medicare Important Message Given:  Yes Patient left prior to IM delivery.  IM mailed to the home.    Amery Vandenbos 07/21/2019, 2:44 PM

## 2019-07-22 ENCOUNTER — Telehealth: Payer: Self-pay

## 2019-07-22 NOTE — Telephone Encounter (Signed)
Transition Care Management Follow-up Telephone Call Date of discharge and from where: 07/21/2019, [facility]   Call placed to patient # [phone], unable to leave a message, voicemail full.  Patient has follow up appt with surgery 07/24/2019.

## 2019-07-23 ENCOUNTER — Telehealth: Payer: Self-pay

## 2019-07-23 NOTE — Telephone Encounter (Signed)
Transition Care Management Follow-up Telephone Call Attempt # 2 Date of discharge and from where: 07/21/2019, [facility]   Call placed to patient # [phone], unable to leave a message, voicemail full.  Patient has follow up appt with surgery 07/24/2019. The phone number for [facility] is on her AVS

## 2019-07-31 ENCOUNTER — Ambulatory Visit: Payer: Medicare Other

## 2019-08-20 ENCOUNTER — Telehealth: Payer: Medicare Other | Admitting: Internal Medicine

## 2019-10-13 ENCOUNTER — Ambulatory Visit: Payer: Medicare Other | Admitting: Critical Care Medicine

## 2019-10-20 ENCOUNTER — Other Ambulatory Visit: Payer: Self-pay

## 2019-10-20 ENCOUNTER — Ambulatory Visit: Payer: Medicare Other | Attending: Critical Care Medicine | Admitting: Critical Care Medicine

## 2019-10-20 ENCOUNTER — Encounter: Payer: Self-pay | Admitting: Critical Care Medicine

## 2019-10-20 VITALS — BP 150/84 | HR 80 | Temp 98.5°F | Resp 16 | Wt 199.4 lb

## 2019-10-20 DIAGNOSIS — E669 Obesity, unspecified: Secondary | ICD-10-CM | POA: Diagnosis not present

## 2019-10-20 DIAGNOSIS — J432 Centrilobular emphysema: Secondary | ICD-10-CM

## 2019-10-20 DIAGNOSIS — I10 Essential (primary) hypertension: Secondary | ICD-10-CM | POA: Diagnosis not present

## 2019-10-20 DIAGNOSIS — G8929 Other chronic pain: Secondary | ICD-10-CM

## 2019-10-20 DIAGNOSIS — M545 Low back pain, unspecified: Secondary | ICD-10-CM | POA: Insufficient documentation

## 2019-10-20 DIAGNOSIS — Z9049 Acquired absence of other specified parts of digestive tract: Secondary | ICD-10-CM

## 2019-10-20 DIAGNOSIS — F32A Depression, unspecified: Secondary | ICD-10-CM

## 2019-10-20 DIAGNOSIS — F329 Major depressive disorder, single episode, unspecified: Secondary | ICD-10-CM | POA: Diagnosis not present

## 2019-10-20 DIAGNOSIS — D869 Sarcoidosis, unspecified: Secondary | ICD-10-CM

## 2019-10-20 MED ORDER — LOSARTAN POTASSIUM-HCTZ 50-12.5 MG PO TABS: 1.0000 | ORAL_TABLET | Freq: Every day | ORAL | 2 refills | Status: DC

## 2019-10-20 MED ORDER — DULOXETINE HCL 40 MG PO CPEP: 40.0000 mg | ORAL_CAPSULE | Freq: Every day | ORAL | 1 refills | Status: DC

## 2019-10-20 MED ORDER — ALBUTEROL SULFATE (2.5 MG/3ML) 0.083% IN NEBU: 2.5000 mg | INHALATION_SOLUTION | Freq: Four times a day (QID) | RESPIRATORY_TRACT | 0 refills | Status: DC | PRN

## 2019-10-20 MED ORDER — TIZANIDINE HCL 4 MG PO TABS: ORAL_TABLET | ORAL | 0 refills | Status: DC

## 2019-10-20 NOTE — Assessment & Plan Note (Signed)
Centrilobular emphysema stable on inhaled medications no changes planned return as needed

## 2019-10-20 NOTE — Assessment & Plan Note (Signed)
Sarcoidosis no evidence of disease activity

## 2019-10-20 NOTE — Progress Notes (Signed)
Subjective:    Patient ID: Carolyn Morton, female    DOB: 12-Oct-1950, 69 y.o.   MRN: [id number]  07/13/18 68 y.o.F here for pulmonary eval from PCP Earlene Plater Hx of severe asthma , two ED visits in Feb  The patient also has a history of sarcoidosis in the 1970s.  She had a liver biopsy that showed granulomas at that time.  She has had scarring in her lungs as well.  She had been on steroids in the past.  She recently moved here from [place] to be closer to her son is undergoing treatment for colon cancer.  She does note increased sinus congestion.  She wakes at night with shortness of breath notes increased wheezing.  She does have the albuterol inhaler and Singulair but she is not able to afford her combination inhalers Breo.  She did take prednisone for 5 days over a week ago and this had some improvement.  05/13/2019 The patient returns today in follow-up after having had pulmonary function studies.  The patient's level of dyspnea is slightly improved on Advair but she still gets dyspneic with exertion and wakens at night with shortness of breath.  There is minimal dry cough.  Pulmonary functions do demonstrate reduction in diffusion capacity significant air trapping and severe peripheral airflow obstruction and moderate central airflow obstruction. Patient notes she had increased cough the last few weeks and is using Tessalon Perles.  She also notes eye irritation with the increased pollen as well.  She also notes increased nasal congestion.  6/21: Patient seen in return follow-up from April and is doing much better.  Patient's dyspnea and cough have improved.  She did not pick up the Patanol eyedrops as they were too expensive.  She is taking Flonase daily along with Zyrtec and is taking the Spiriva and the Advair.  The patient still has a bit of a cough and tends to clear her throat a lot.  She is been using cough drops.  Patient is due up a tetanus vaccine and Prevnar vaccine.  Note she did have  a Covid vaccine the last one was May 13 and was [employer]. The patient does have postnasal drip and will cough sometimes at night.  She takes Benadryl and Zyrtec over-the-counter which seems to help.  Tessalon Perles helped to some degree.  10/20/2019 Since the last visit the patient did require emergent cholecystectomy and she has done well with this. The patient is transferring her care to the [facility] at this time. From the standpoint of her dyspnea she is at baseline. The inhalers have been helpful. She also states Singulair is helpful as well. She is requesting refill on Zanaflex for back spasms. She did not receive a flu vaccine this year because she is had severe allergic reactions with this in the past. She has received her full Covid vaccine series. She is to not turned in her Cologuard test and does not schedule her mammogram as of yet.  See shortness of breath assessment  Shortness of Breath This is a chronic problem. The current episode started more than 1 year ago. The problem occurs daily. The problem has been rapidly improving (wiht pred pulse). Associated symptoms include rhinorrhea and sputum production. Pertinent negatives include no abdominal pain, chest pain, ear pain, headaches, hemoptysis, leg pain, leg swelling, orthopnea, PND, rash, sore throat, swollen glands, vomiting or wheezing. The symptoms are aggravated by animal exposure, emotional upset, weather changes, odors, fumes, pollens and any  activity (dogs). Associated symptoms comments: Mucus is clear Nose stopped up, eyes burning, sinus pressure on Left side Sinus headaches. Risk factors include smoking. She has tried beta agonist inhalers and oral steroids for the symptoms. The treatment provided significant relief. Her past medical history is significant for asthma and COPD. (Sarcoid)    Past Medical History:  Diagnosis Date  . COPD (chronic obstructive pulmonary disease) (HCC)   .  Hypertension      Family History  Family history unknown: Yes     Social History   Socioeconomic History  . Marital status: Widowed    Spouse name: Not on file  . Number of children: Not on file  . Years of education: Not on file  . Highest education level: Not on file  Occupational History  . Not on file  Tobacco Use  . Smoking status: Never Smoker  . Smokeless tobacco: Never Used  Vaping Use  . Vaping Use: Former  Substance and Sexual Activity  . Alcohol use: Not Currently  . Drug use: Never  . Sexual activity: Not on file  Other Topics Concern  . Not on file  Social History Narrative  . Not on file   Social Determinants of Health   Financial Resource Strain:   . Difficulty of Paying Living Expenses: Not on file  Food Insecurity:   . Worried About Programme researcher, broadcasting/film/video in the Last Year: Not on file  . Ran Out of Food in the Last Year: Not on file  Transportation Needs:   . Lack of Transportation (Medical): Not on file  . Lack of Transportation (Non-Medical): Not on file  Physical Activity:   . Days of Exercise per Week: Not on file  . Minutes of Exercise per Session: Not on file  Stress:   . Feeling of Stress : Not on file  Social Connections:   . Frequency of Communication with Friends and Family: Not on file  . Frequency of Social Gatherings with Friends and Family: Not on file  . Attends Religious Services: Not on file  . Active Member of Clubs or Organizations: Not on file  . Attends Banker Meetings: Not on file  . Marital Status: Not on file  Intimate Partner Violence:   . Fear of Current or Ex-Partner: Not on file  . Emotionally Abused: Not on file  . Physically Abused: Not on file  . Sexually Abused: Not on file     Allergies  Allergen Reactions  . Flu Virus Vaccine Other (See Comments)    Fever 106     Outpatient Medications Prior to Visit  Medication Sig Dispense Refill  . acetaminophen (TYLENOL) 500 MG tablet Take 2 tablets  (1,000 mg total) by mouth every 8 (eight) hours as needed. 30 tablet 0  . albuterol (VENTOLIN HFA) 108 (90 Base) MCG/ACT inhaler INHALE 2 PUFFS INTO THE LUNGS EVERY 6 HOURS AS NEEDED FOR WHEEZING OR SHORTNESS OF BREATH (Patient taking differently: Inhale 2 puffs into the lungs every 6 (six) hours as needed for wheezing or shortness of breath. ) 54 g 1  . aspirin 81 MG chewable tablet Chew 81 mg by mouth as needed (for general health).    . docusate sodium (COLACE) 100 MG capsule Take 1 capsule (100 mg total) by mouth 2 (two) times daily as needed for mild constipation. 10 capsule 0  . fluticasone (FLONASE) 50 MCG/ACT nasal spray Place 2 sprays into both nostrils daily. 16 g 6  . fluticasone-salmeterol (ADVAIR HFA)  230-21 MCG/ACT inhaler Inhale 2 puffs into the lungs 2 (two) times daily. 1 Inhaler 12  . naphazoline-pheniramine (NAPHCON-A) 0.025-0.3 % ophthalmic solution Place 1 drop into both eyes daily as needed for eye irritation or allergies.    . Tiotropium Bromide Monohydrate (SPIRIVA RESPIMAT) 2.5 MCG/ACT AERS Two puff daily (Patient taking differently: Inhale 2 puffs into the lungs daily. Two puff daily) 4 g 11  . albuterol (PROVENTIL) (2.5 MG/3ML) 0.083% nebulizer solution Take 3 mLs (2.5 mg total) by nebulization every 6 (six) hours as needed for wheezing or shortness of breath. 75 mL 0  . DULoxetine (CYMBALTA) 20 MG capsule Take 1 capsule (20 mg total) by mouth daily. 90 capsule 1  . losartan-hydrochlorothiazide (HYZAAR) 50-12.5 MG tablet Take 1 tablet by mouth daily. 90 tablet 2  . ondansetron (ZOFRAN-ODT) 4 MG disintegrating tablet Take 1 tablet (4 mg total) by mouth every 6 (six) hours as needed for nausea. (Patient not taking: Reported on 10/20/2019) 20 tablet 0  . oxyCODONE (OXY IR/ROXICODONE) 5 MG immediate release tablet Take 1 tablet (5 mg total) by mouth every 6 (six) hours as needed for severe pain. (Patient not taking: Reported on 10/20/2019) 15 tablet 0   No facility-administered  medications prior to visit.     Review of Systems  HENT: Positive for rhinorrhea. Negative for ear pain and sore throat.   Respiratory: Positive for sputum production and shortness of breath. Negative for hemoptysis and wheezing.   Cardiovascular: Negative for chest pain, orthopnea, leg swelling and PND.  Gastrointestinal: Negative for abdominal pain and vomiting.  Skin: Negative for rash.  Neurological: Negative for headaches.       Objective:   Physical Exam Vitals:   10/20/19 1017  BP: (!) 150/84  Pulse: 80  Resp: 16  Temp: 98.5 F (36.9 C)  SpO2: 93%  Weight: 199 lb 6.4 oz (90.4 kg)    Gen: Pleasant, well-nourished, in no distress,  normal affect  ENT: No lesions,  mouth clear,  oropharynx clear, no postnasal drip, nasal turbinate edema is markedly better  Neck: No JVD, no TMG, no carotid bruits  Lungs: No use of accessory muscles, no dullness to percussion, distant breath sounds but improved airflow  cardiovascular: RRR, heart sounds normal, no murmur or gallops, no peripheral edema  Abdomen: soft and NT, no HSM,  BS normal  Musculoskeletal: No deformities, no cyanosis or clubbing  Neuro: alert, non focal  Skin: Warm, no lesions or rashes  Chest x-ray from February 2021 shows increased interstitial markings at the bases of the lungs and cardiomegaly PFTS  Ref Range & Units 2 d ago  FVC-Pre L 1.26   FVC-%Pred-Pre % 64   FVC-Post L 1.32   FVC-%Pred-Post % 67   FVC-%Change-Post % 4   FEV1-Pre L 0.86   FEV1-%Pred-Pre % 56   FEV1-Post L 0.92   FEV1-%Pred-Post % 60   FEV1-%Change-Post % 7   FEV6-Pre L 1.26   FEV6-%Pred-Pre % 67   FEV6-Post L 1.32   FEV6-%Pred-Post % 70   FEV6-%Change-Post % 4   Pre FEV1/FVC ratio % 68   FEV1FVC-%Pred-Pre % 87   Post FEV1/FVC ratio % 69   FEV1FVC-%Change-Post % 2   Pre FEV6/FVC Ratio % 100   FEV6FVC-%Pred-Pre % 104   Post FEV6/FVC ratio % 100   FEV6FVC-%Pred-Post % 104   FEV6FVC-%Change-Post % 0   FEF 25-75 Pre  L/sec 0.45   FEF2575-%Pred-Pre % 31   FEF 25-75 Post L/sec 0.60   FEF2575-%Pred-Post %  41   FEF2575-%Change-Post % 32   RV L 3.51   RV % pred % 178   TLC L 5.09   TLC % pred % 114   DLCO unc ml/min/mmHg 10.10   DLCO unc % pred % 59   DL/VA ml/min/mmHg/L 0.16   DL/VA % pred % 88    PHQ9 SCORE ONLY 10/20/2019 07/13/2019 05/13/2019  PHQ-9 Total Score 5 4 6    GAD 7 : Generalized Anxiety Score 10/20/2019 07/13/2019 05/13/2019 04/07/2019  Nervous, Anxious, on Edge 2 1 1 1   Control/stop worrying 1 1 1 1   Worry too much - different things 1 1 1 1   Trouble relaxing 1 1 1  0  Restless 0 0 0 0  Easily annoyed or irritable 0 0 0 0  Afraid - awful might happen 2 1 1 1   Total GAD 7 Score 7 5 5 4   Anxiety Difficulty - - - Somewhat difficult        Assessment & Plan:  I personally reviewed all images and lab data in the [facility] system as well as any outside material available during this office visit and agree with the  radiology impressions.   Centrilobular emphysema (HCC)Gold B with asthma overlap syndrome Centrilobular emphysema stable on inhaled medications no changes planned return as needed  Status post laparoscopic cholecystectomy Patient doing well status post cholecystectomy done on an emergent basis in June of this year  Sarcoidosis Sarcoidosis no evidence of disease activity  Obesity (BMI 30-39.9) Patient given guidance on weight loss  Chronic low back pain Chronic low back pain lumbar area due to muscular imbalance muscle spasticity and obesity  We will refill the Zanaflex 2 mg as needed three times daily   was seen today for asthma.  Diagnoses and all orders for this visit:  Centrilobular emphysema (HCC)Gold B with asthma overlap syndrome  Depression, unspecified depression type -     DULoxetine 40 MG CPEP; Take 40 mg by mouth daily.  Obesity (BMI 30-39.9)  Essential hypertension  Status post laparoscopic cholecystectomy  Sarcoidosis  Chronic bilateral  low back pain without sciatica  Other orders -     losartan-hydrochlorothiazide (HYZAAR) 50-12.5 MG tablet; Take 1 tablet by mouth daily. -     albuterol (PROVENTIL) (2.5 MG/3ML) 0.083% nebulizer solution; Take 3 mLs (2.5 mg total) by nebulization every 6 (six) hours as needed for wheezing or shortness of breath. -     tiZANidine (ZANAFLEX) 4 MG tablet; 1/2 - 1 tablet every 6 hours as needed for muscle spasm   Patient given guidance on when she can receive her Pfizer booster vaccine which would be mid November to mid December timeframe

## 2019-10-20 NOTE — Assessment & Plan Note (Signed)
Patient given guidance on weight loss

## 2019-10-20 NOTE — Patient Instructions (Signed)
Please turn in your cologuard  Please schedule your mammogram  Refills on your medications including zanaflex was sent to the pharmcy  You declined the flu vaccine due to history of adverse reactions  Return to Dr Delford Field 4 months  Keep your primary care appointment

## 2019-10-20 NOTE — Assessment & Plan Note (Signed)
Chronic low back pain lumbar area due to muscular imbalance muscle spasticity and obesity  We will refill the Zanaflex 2 mg as needed three times daily

## 2019-10-20 NOTE — Assessment & Plan Note (Signed)
Patient doing well status post cholecystectomy done on an emergent basis in June of this year

## 2019-10-20 NOTE — Assessment & Plan Note (Signed)
Will increase Cymbalta to 40 mg daily to cover the musculoskeletal pain and depression

## 2019-10-23 ENCOUNTER — Other Ambulatory Visit: Payer: Self-pay | Admitting: Critical Care Medicine

## 2019-10-23 NOTE — Telephone Encounter (Signed)
Requested medication (s) are due for refill today: Early  Requested medication (s) are on the active medication list: Yes  Last refill:  04/07/19 1 with 12 refills.  Future visit scheduled: Yes  Notes to clinic:  See request.    Requested Prescriptions  Pending Prescriptions Disp Refills   ADVAIR HFA 230-21 MCG/ACT inhaler [Pharmacy Med Name: ADVAIR HFA 230/21MCG ORAL INH 120'S] 12 g     Sig: INHALE 2 PUFFS INTO THE LUNGS TWICE DAILY      Pulmonology:  Combination Products Passed - 10/23/2019  9:50 AM      Passed - Valid encounter within last 12 months    Recent Outpatient Visits           3 days ago Centrilobular emphysema (HCC)Gold B with asthma overlap syndrome   [facility] Storm Frisk, MD   3 months ago Centrilobular emphysema (HCC)Gold B with asthma overlap syndrome   [facility] Storm Frisk, MD   5 months ago Centrilobular emphysema [facility])   [place] [facility] Storm Frisk, MD   6 months ago Moderate persistent asthma without complication   [facility] Storm Frisk, MD       Future Appointments             In 3 weeks Claiborne Rigg, NP [facility]

## 2019-10-24 ENCOUNTER — Other Ambulatory Visit: Payer: Self-pay | Admitting: Critical Care Medicine

## 2019-10-24 NOTE — Telephone Encounter (Signed)
Requested medication (s) are due for refill today: no  Requested medication (s) are on the active medication list: yes  Last refill:  10/20/19  Future visit scheduled: yes  Notes to clinic:  NT not delegated to refill or refuse med   Requested Prescriptions  Pending Prescriptions Disp Refills   tiZANidine (ZANAFLEX) 4 MG tablet [Pharmacy Med Name: TIZANIDINE 4MG  TABLETS] 30 tablet 0    Sig: TAKE 1/2 TO 1 TABLET BY MOUTH EVERY 6 HOURS AS NEEDED FOR MUSCLE SPASM      Not Delegated - Cardiovascular:  Alpha-2 Agonists - tizanidine Failed - 10/24/2019  8:03 AM      Failed - This refill cannot be delegated      Passed - Valid encounter within last 6 months    Recent Outpatient Visits           4 days ago Centrilobular emphysema (HCC)Gold B with asthma overlap syndrome   [facility] 12/24/2019, MD   3 months ago Centrilobular emphysema (HCC)Gold B with asthma overlap syndrome   [facility] Storm Frisk, MD   5 months ago Centrilobular emphysema [facility])   [place] [facility], MD   6 months ago Moderate persistent asthma without complication   [facility], MD       Future Appointments             In 3 weeks Storm Frisk, NP [facility]

## 2019-10-25 ENCOUNTER — Other Ambulatory Visit: Payer: Self-pay | Admitting: Critical Care Medicine

## 2019-11-11 NOTE — Telephone Encounter (Signed)
Error

## 2019-11-16 ENCOUNTER — Other Ambulatory Visit: Payer: Self-pay

## 2019-11-16 ENCOUNTER — Encounter: Payer: Self-pay | Admitting: Nurse Practitioner

## 2019-11-16 ENCOUNTER — Ambulatory Visit: Payer: Medicare Other | Attending: Nurse Practitioner | Admitting: Nurse Practitioner

## 2019-11-16 DIAGNOSIS — Z1231 Encounter for screening mammogram for malignant neoplasm of breast: Secondary | ICD-10-CM

## 2019-11-16 DIAGNOSIS — I10 Essential (primary) hypertension: Secondary | ICD-10-CM | POA: Diagnosis not present

## 2019-11-16 DIAGNOSIS — R7309 Other abnormal glucose: Secondary | ICD-10-CM

## 2019-11-16 DIAGNOSIS — E669 Obesity, unspecified: Secondary | ICD-10-CM

## 2019-11-16 DIAGNOSIS — M5442 Lumbago with sciatica, left side: Secondary | ICD-10-CM

## 2019-11-16 DIAGNOSIS — G8929 Other chronic pain: Secondary | ICD-10-CM

## 2019-11-16 DIAGNOSIS — F331 Major depressive disorder, recurrent, moderate: Secondary | ICD-10-CM

## 2019-11-16 DIAGNOSIS — Z7689 Persons encountering health services in other specified circumstances: Secondary | ICD-10-CM | POA: Diagnosis not present

## 2019-11-16 MED ORDER — TIZANIDINE HCL 4 MG PO TABS: ORAL_TABLET | ORAL | 2 refills | Status: DC

## 2019-11-16 MED ORDER — DULOXETINE HCL 60 MG PO CPEP: 60.0000 mg | ORAL_CAPSULE | Freq: Every day | ORAL | 3 refills | Status: DC

## 2019-11-16 NOTE — Progress Notes (Signed)
Virtual Visit via Telephone Note Due to national recommendations of social distancing due to [place] 19, telehealth visit is felt to be most appropriate for this patient at this time.  I discussed the limitations, risks, security and privacy concerns of performing an evaluation and management service by telephone and the availability of in person appointments. I also discussed with the patient that there may be a patient responsible charge related to this service. The patient expressed understanding and agreed to proceed.    I connected with Carolyn Morton on 11/16/19  at   8:50 AM EDT  EDT by telephone and verified that I am speaking with the correct person using two identifiers.   Consent I discussed the limitations, risks, security and privacy concerns of performing an evaluation and management service by telephone and the availability of in person appointments. I also discussed with the patient that there may be a patient responsible charge related to this service. The patient expressed understanding and agreed to proceed.   Location of Patient: Private Residence   Location of Provider: [place] and [employer] Office    Persons participating in Telemedicine visit: [name] FNP-BC [place]    History of Present Illness: Telemedicine visit for: Establish Care  has a past medical history of COPD (chronic obstructive pulmonary disease) ([place]), Hypertension, and Sarcoidosis. Came to [place] last January. Retired Marine scientist.    Patient has been counseled on age-appropriate routine health concerns for screening and prevention. These are reviewed and up-to-date. Referrals have been placed accordingly. Immunizations are up-to-date or declined.    Mammogram: Patient is planning to schedule.  Colonoscopy: Forgot to turn in her cologuard. She still has the kit and will try to get it turned in.    Chronic Pain and Depression She has chronic back pain. Was seeing a pain  therapist in [place]. Duloxetine was recently increased to 28m as 355mwas ineffective however she has not been able to pick this up as it has not been available at her pharmacy . She uses an ice pack in her underwear for pain relief. She was denied back surgery by her insurance company. Medications tried: Celebrex, fentanyl patches, curcumin (tumeric), Zanaflex. Pain is in the lower back and radiates down to the left hip, leg and foot.    COPD Has seen our Pulmonologist Dr. WrJoya Gaskinsor Centrilobular emphysema GOLD B. She stopped smoking 10 years ago. History of sarcoidosis in the 1970s. Per previous review of Dr. WrBettina Gaviaote.  She had a liver biopsy that showed granulomas at that time.  She has had scarring in her lungs as well.  She had been on steroids in the past.  [place] is undergoing treatment for colon cancer. She has been waiting on her Spiriva and states it is on back order.    Essential Hypertension Not well controlled. She does not m probably mildly onitor her blood pressure at home.  She is currently taking Hyzaar 50-12.5 mg daily as prescribed.  She does endorse increased stress. Son has colon cancer. Fiance passed. Denies chest pain, palpitations, lightheadedness, dizziness, headaches or BLE edema.   BP Readings from Last 3 Encounters:  10/20/19 (!) 150/84  07/21/19 (!) 103/56  07/13/19 138/78      Past Medical History:  Diagnosis Date  . COPD (chronic obstructive pulmonary disease) ([place]  . Hypertension   . Sarcoidosis     Past Surgical History:  Procedure Laterality  Date  . CHOLECYSTECTOMY N/A 07/19/2019   Procedure: LAPAROSCOPIC CHOLECYSTECTOMY WITH INTRAOPERATIVE CHOLANGIOGRAM;  Surgeon: Greer Pickerel, MD;  Location: [facility];  Service: General;  Laterality: N/A;    Family History  Family history unknown: Yes    Social History   Socioeconomic History  . Marital status: Widowed    Spouse name: Not on file  .  Number of children: Not on file  . Years of education: Not on file  . Highest education level: Not on file  Occupational History  . Not on file  Tobacco Use  . Smoking status: Former Smoker    Quit date: 01/22/2010    Years since quitting: 9.8  . Smokeless tobacco: Never Used  Vaping Use  . Vaping Use: Former  Substance and Sexual Activity  . Alcohol use: Not Currently  . Drug use: Never  . Sexual activity: Not on file  Other Topics Concern  . Not on file  Social History Narrative  . Not on file   Social Determinants of Health   Financial Resource Strain:   . Difficulty of Paying Living Expenses: Not on file  Food Insecurity:   . Worried About Charity fundraiser in the Last Year: Not on file  . Ran Out of Food in the Last Year: Not on file  Transportation Needs:   . Lack of Transportation (Medical): Not on file  . Lack of Transportation (Non-Medical): Not on file  Physical Activity:   . Days of Exercise per Week: Not on file  . Minutes of Exercise per Session: Not on file  Stress:   . Feeling of Stress : Not on file  Social Connections:   . Frequency of Communication with Friends and Family: Not on file  . Frequency of Social Gatherings with Friends and Family: Not on file  . Attends Religious Services: Not on file  . Active Member of Clubs or Organizations: Not on file  . Attends Archivist Meetings: Not on file  . Marital Status: Not on file     Observations/Objective: Awake, alert and oriented x 3   Review of Systems  Constitutional: Negative for fever, malaise/fatigue and weight loss.  HENT: Negative.  Negative for nosebleeds.   Eyes: Negative.  Negative for blurred vision, double vision and photophobia.  Respiratory: Positive for shortness of breath (with exertion). Negative for cough and wheezing.   Cardiovascular: Negative.  Negative for chest pain, palpitations and leg swelling.  Gastrointestinal: Negative.  Negative for heartburn, nausea and  vomiting.  Musculoskeletal: Positive for back pain, joint pain and myalgias.  Neurological: Negative.  Negative for dizziness, focal weakness, seizures and headaches.  Psychiatric/Behavioral: Positive for depression. Negative for suicidal ideas. The patient is nervous/anxious.     Assessment and Plan: Carolyn Morton was seen today for new patient (initial visit).  Diagnoses and all orders for this visit:  Encounter to establish care  Essential hypertension -     CMP14+EGFR; Future Continue all antihypertensives as prescribed.  Remember to bring in your blood pressure log with you for your follow up appointment.  DASH/Mediterranean Diets are healthier choices for HTN.    Obesity (BMI 30-39.9) -     Lipid panel; Future Recommended diet and exercise for person with BMI >30. Instructed: You must burn more calories than you eat. Losing 5 percent of your body weight should be considered a success. In the longer term, losing more than 15 percent of your body weight and staying at this weight is an extremely good  result. However, keep in mind that even losing 5 percent of your body weight leads to important health benefits, so try not to get discouraged if you're not able to lose more than this.  Elevated glucose -     Hemoglobin A1c; Future  Breast cancer screening by mammogram -     MM 3D SCREEN BREAST BILATERAL; Future  Chronic bilateral low back pain with left-sided sciatica -     tiZANidine (ZANAFLEX) 4 MG tablet; 1/2 - 1 tablet every 6 hours as needed for muscle spasm -     DULoxetine (CYMBALTA) 60 MG capsule; Take 1 capsule (60 mg total) by mouth daily. Recommended silver sneakers program at the local [employer] Work on losing weight to help reduce back pain. May alternate with heat and ice application for pain relief. May also alternate with acetaminophen as prescribed for back pain. Other alternatives include massage, acupuncture and water aerobics.  You must stay active and avoid a sedentary  lifestyle.    Moderate episode of recurrent major depressive disorder (HCC) -     DULoxetine (CYMBALTA) 60 MG capsule; Take 1 capsule (60 mg total) by mouth daily.     Follow Up Instructions Return in about 4 weeks (around 12/14/2019) for HTN.     I discussed the assessment and treatment plan with the patient. The patient was provided an opportunity to ask questions and all were answered. The patient agreed with the plan and demonstrated an understanding of the instructions.   The patient was advised to call back or seek an in-person evaluation if the symptoms worsen or if the condition fails to improve as anticipated.  I provided 24 minutes of non-face-to-face time during this encounter including median intraservice time, reviewing previous notes, labs, imaging, medications and explaining diagnosis and management.  Gildardo Pounds, FNP-BC

## 2019-11-16 NOTE — Progress Notes (Signed)
[facility] out of stock having to do more nebulizers  Duloxetine on back order  Has back pain takes OTC meds: Curcumin and uses ice packs.

## 2019-11-17 ENCOUNTER — Encounter: Payer: Self-pay | Admitting: Nurse Practitioner

## 2019-12-31 MED ORDER — AZITHROMYCIN 250 MG PO TABS: ORAL_TABLET | ORAL | 0 refills | Status: DC

## 2019-12-31 MED ORDER — PREDNISONE 10 MG PO TABS: ORAL_TABLET | ORAL | 0 refills | Status: DC

## 2019-12-31 MED ORDER — PROMETHAZINE-DM 6.25-15 MG/5ML PO SYRP: 5.0000 mL | ORAL_SOLUTION | Freq: Four times a day (QID) | ORAL | 0 refills | Status: DC | PRN

## 2020-03-09 ENCOUNTER — Encounter: Payer: Self-pay | Admitting: Nurse Practitioner

## 2020-03-09 ENCOUNTER — Telehealth: Payer: Self-pay | Admitting: Nurse Practitioner

## 2020-03-09 NOTE — Telephone Encounter (Signed)
Pt was told OK to schedule a lab appt, via mychart message from [facility],  however there are no lab orders. Please advise.

## 2020-03-13 NOTE — Telephone Encounter (Signed)
Please have her make lab appt. I will place lab orders at that time

## 2020-03-15 NOTE — Telephone Encounter (Signed)
Attempt to reach patient to schedule a lab appt. No answer and unablet to LVM. Phone keeps ringing.

## 2020-04-22 ENCOUNTER — Other Ambulatory Visit: Payer: Self-pay | Admitting: Critical Care Medicine

## 2020-04-22 ENCOUNTER — Other Ambulatory Visit: Payer: Self-pay | Admitting: Family Medicine

## 2020-04-22 ENCOUNTER — Encounter: Payer: Self-pay | Admitting: Nurse Practitioner

## 2020-04-22 MED ORDER — ALBUTEROL SULFATE HFA 108 (90 BASE) MCG/ACT IN AERS: 2.0000 | INHALATION_SPRAY | Freq: Four times a day (QID) | RESPIRATORY_TRACT | 1 refills | Status: DC | PRN

## 2020-04-22 NOTE — Telephone Encounter (Signed)
Requested medications are due for refill today yes  Requested medications are on the active medication list yes  Last refill 01/06/20  Last visit 09/2019  Future visit scheduled no  Notes to clinic Was to return in January, no appt scheduled, please assess.

## 2020-04-25 NOTE — Telephone Encounter (Signed)
PT is very frustrated as there has been a misunderstanding, she can not get sch for a lab appt due to no orders in Active. Pt needs lab orders for bloodwork. Also pt states that she has been trying to get back in with Dr. Delford Field as she really loved him, states he is PCP, wanted to know if he was sick, seems on system that she is assigned to [place] but she thinks Dr Delford Field is her dr and wants an appt with him. Did not disclose to pt re DR Delford Field, office at lunch. Return call to [phone] or [phone]. Orders NEEDED in order to sch!  Leave a message if can not reach as sometimes calls go straight to VM

## 2020-04-29 ENCOUNTER — Encounter: Payer: Self-pay | Admitting: Nurse Practitioner

## 2020-04-30 MED ORDER — PREDNISONE 10 MG PO TABS
ORAL_TABLET | ORAL | 0 refills | Status: DC
Start: 2020-04-30 — End: 2020-05-10

## 2020-05-02 NOTE — Telephone Encounter (Signed)
Patients do not need lab orders prior to making lab appointments. Please schedule for labs if patients are requesting. I also had in my notes that she needed labs so that should have been enough information for her to schedule. I usually place my lab orders the night before.

## 2020-05-02 NOTE — Telephone Encounter (Signed)
Called patient and LVM advising patient to call [phone] to schedule an appointment with Dr. Delford Field. If patient returns phone call please transfer to office so that someone can work-in patient for an appointment in April per Dr. Delford Field.

## 2020-05-03 ENCOUNTER — Other Ambulatory Visit: Payer: Medicare Other

## 2020-05-09 ENCOUNTER — Other Ambulatory Visit: Payer: Self-pay | Admitting: Nurse Practitioner

## 2020-05-09 ENCOUNTER — Ambulatory Visit: Payer: Medicare Other | Attending: Nurse Practitioner

## 2020-05-09 ENCOUNTER — Other Ambulatory Visit: Payer: Self-pay

## 2020-05-09 DIAGNOSIS — I10 Essential (primary) hypertension: Secondary | ICD-10-CM | POA: Diagnosis not present

## 2020-05-09 DIAGNOSIS — R7309 Other abnormal glucose: Secondary | ICD-10-CM

## 2020-05-09 DIAGNOSIS — R7989 Other specified abnormal findings of blood chemistry: Secondary | ICD-10-CM | POA: Diagnosis not present

## 2020-05-09 DIAGNOSIS — E669 Obesity, unspecified: Secondary | ICD-10-CM

## 2020-05-09 NOTE — Progress Notes (Signed)
Subjective:    Patient ID: Carolyn Morton, female    DOB: 05/15/1950, 70 y.o.   MRN: [id number]  07/13/18 70 y.o.F here for pulmonary eval from PCP Earlene Plater Hx of severe asthma , two ED visits in Feb  The patient also has a history of sarcoidosis in the 1970s.  She had a liver biopsy that showed granulomas at that time.  She has had scarring in her lungs as well.  She had been on steroids in the past.  She recently moved here from [place] to be closer to her son is undergoing treatment for colon cancer.  She does note increased sinus congestion.  She wakes at night with shortness of breath notes increased wheezing.  She does have the albuterol inhaler and Singulair but she is not able to afford her combination inhalers Breo.  She did take prednisone for 5 days over a week ago and this had some improvement.  05/13/2019 The patient returns today in follow-up after having had pulmonary function studies.  The patient's level of dyspnea is slightly improved on Advair but she still gets dyspneic with exertion and wakens at night with shortness of breath.  There is minimal dry cough.  Pulmonary functions do demonstrate reduction in diffusion capacity significant air trapping and severe peripheral airflow obstruction and moderate central airflow obstruction. Patient notes she had increased cough the last few weeks and is using Tessalon Perles.  She also notes eye irritation with the increased pollen as well.  She also notes increased nasal congestion.  6/21: Patient seen in return follow-up from April and is doing much better.  Patient's dyspnea and cough have improved.  She did not pick up the Patanol eyedrops as they were too expensive.  She is taking Flonase daily along with Zyrtec and is taking the Spiriva and the Advair.  The patient still has a bit of a cough and tends to clear her throat a lot.  She is been using cough drops.  Patient is due up a tetanus vaccine and Prevnar vaccine.  Note she did have  a Covid vaccine the last one was May 13 and was [employer]. The patient does have postnasal drip and will cough sometimes at night.  She takes Benadryl and Zyrtec over-the-counter which seems to help.  Tessalon Perles helped to some degree.  10/20/2019 Since the last visit the patient did require emergent cholecystectomy and she has done well with this. The patient is transferring her care to the [facility] at this time. From the standpoint of her dyspnea she is at baseline. The inhalers have been helpful. She also states Singulair is helpful as well. She is requesting refill on Zanaflex for back spasms. She did not receive a flu vaccine this year because she is had severe allergic reactions with this in the past. She has received her full Covid vaccine series. She is to not turned in her Cologuard test and does not schedule her mammogram as of yet.  See shortness of breath assessment  4/19 Patient seen in return follow-up for COPD patient continues to have difficulty with breathing over the last 2 months she took a course of prednisone and azithromycin 2 months ago without much improvement she has increased mucus production symptoms are worse see shortness of breath assessment below.  And the patient demonstrates her HFA technique to me it is poor and she is not using the inhalers properly.    Shortness of Breath This is a chronic  problem. The current episode started more than 1 year ago. The problem occurs daily. The problem has been gradually worsening (wiht pred pulse). Associated symptoms include rhinorrhea, sputum production and wheezing. Pertinent negatives include no abdominal pain, chest pain, ear pain, headaches, hemoptysis, leg pain, leg swelling, neck pain, orthopnea, PND, rash, sore throat, swollen glands or vomiting. The symptoms are aggravated by animal exposure, emotional upset, weather changes, odors, fumes, pollens and any activity (dogs). Associated  symptoms comments: Mucus is clear Sinus headaches rhinorhea cloudy. Risk factors include smoking. She has tried beta agonist inhalers and oral steroids for the symptoms. The treatment provided significant relief. Her past medical history is significant for asthma and COPD. (Sarcoid)    Past Medical History:  Diagnosis Date  . COPD (chronic obstructive pulmonary disease) (HCC)   . Hypertension   . Sarcoidosis      Family History  Family history unknown: Yes     Social History   Socioeconomic History  . Marital status: Widowed    Spouse name: Not on file  . Number of children: Not on file  . Years of education: Not on file  . Highest education level: Not on file  Occupational History  . Not on file  Tobacco Use  . Smoking status: Former Smoker    Quit date: 01/22/2010    Years since quitting: 10.3  . Smokeless tobacco: Never Used  Vaping Use  . Vaping Use: Former  Substance and Sexual Activity  . Alcohol use: Not Currently  . Drug use: Never  . Sexual activity: Not on file  Other Topics Concern  . Not on file  Social History Narrative  . Not on file   Social Determinants of Health   Financial Resource Strain: Not on file  Food Insecurity: Not on file  Transportation Needs: Not on file  Physical Activity: Not on file  Stress: Not on file  Social Connections: Not on file  Intimate Partner Violence: Not on file     No Active Allergies   Outpatient Medications Prior to Visit  Medication Sig Dispense Refill  . acetaminophen (TYLENOL) 500 MG tablet Take 2 tablets (1,000 mg total) by mouth every 8 (eight) hours as needed. 30 tablet 0  . albuterol (PROVENTIL) (2.5 MG/3ML) 0.083% nebulizer solution Take 3 mLs (2.5 mg total) by nebulization every 6 (six) hours as needed for wheezing or shortness of breath. 75 mL 0  . albuterol (VENTOLIN HFA) 108 (90 Base) MCG/ACT inhaler Inhale 2 puffs into the lungs every 6 (six) hours as needed for wheezing or shortness of breath. 18 g  1  . aspirin 81 MG chewable tablet Chew 81 mg by mouth as needed (for general health).    . fluticasone (FLONASE) 50 MCG/ACT nasal spray Place 2 sprays into both nostrils daily. 16 g 6  . Tiotropium Bromide Monohydrate (SPIRIVA RESPIMAT) 2.5 MCG/ACT AERS Two puff daily 4 g 11  . tiZANidine (ZANAFLEX) 4 MG tablet 1/2 - 1 tablet every 6 hours as needed for muscle spasm 30 tablet 2  . ADVAIR HFA 230-21 MCG/ACT inhaler INHALE 2 PUFFS INTO THE LUNGS TWICE DAILY 12 g 2  . losartan-hydrochlorothiazide (HYZAAR) 50-12.5 MG tablet Take 1 tablet by mouth daily. 90 tablet 2  . DULoxetine (CYMBALTA) 60 MG capsule Take 1 capsule (60 mg total) by mouth daily. 30 capsule 3  . azithromycin (ZITHROMAX) 250 MG tablet Take two once then one daily until gone (Patient not taking: Reported on 05/10/2020) 6 tablet 0  . predniSONE (  DELTASONE) 10 MG tablet Take 4 tablets daily for 5 days then stop (Patient not taking: Reported on 05/10/2020) 20 tablet 0  . promethazine-dextromethorphan (PROMETHAZINE-DM) 6.25-15 MG/5ML syrup Take 5 mLs by mouth 4 (four) times daily as needed for cough. (Patient not taking: Reported on 05/10/2020) 180 mL 0   No facility-administered medications prior to visit.     Review of Systems  HENT: Positive for rhinorrhea. Negative for ear pain and sore throat.   Respiratory: Positive for sputum production, shortness of breath and wheezing. Negative for hemoptysis.   Cardiovascular: Negative for chest pain, orthopnea, leg swelling and PND.  Gastrointestinal: Negative for abdominal pain and vomiting.  Musculoskeletal: Negative for neck pain.  Skin: Negative for rash.  Neurological: Negative for headaches.       Objective:   Physical Exam  Vitals:   05/10/20 1028  BP: (!) 160/80  Pulse: 81  Resp: 20  SpO2: 95%  Weight: 211 lb 9.6 oz (96 kg)  Height: 5' (1.524 m)    Gen: Pleasant, well-nourished, in no distress,  normal affect  ENT: No lesions,  mouth clear,  oropharynx clear, no  postnasal drip, nasal turbinate edema is markedly better  Neck: No JVD, no TMG, no carotid bruits  Lungs: No use of accessory muscles, no dullness to percussion, distant breath sounds but improved airflow  cardiovascular: RRR, heart sounds normal, no murmur or gallops, no peripheral edema  Abdomen: soft and NT, no HSM,  BS normal  Musculoskeletal: No deformities, no cyanosis or clubbing  Neuro: alert, non focal  Skin: Warm, no lesions or rashes  Chest x-ray from February 2021 shows increased interstitial markings at the bases of the lungs and cardiomegaly PFTS  Ref Range & Units 2 d ago  FVC-Pre L 1.26   FVC-%Pred-Pre % 64   FVC-Post L 1.32   FVC-%Pred-Post % 67   FVC-%Change-Post % 4   FEV1-Pre L 0.86   FEV1-%Pred-Pre % 56   FEV1-Post L 0.92   FEV1-%Pred-Post % 60   FEV1-%Change-Post % 7   FEV6-Pre L 1.26   FEV6-%Pred-Pre % 67   FEV6-Post L 1.32   FEV6-%Pred-Post % 70   FEV6-%Change-Post % 4   Pre FEV1/FVC ratio % 68   FEV1FVC-%Pred-Pre % 87   Post FEV1/FVC ratio % 69   FEV1FVC-%Change-Post % 2   Pre FEV6/FVC Ratio % 100   FEV6FVC-%Pred-Pre % 104   Post FEV6/FVC ratio % 100   FEV6FVC-%Pred-Post % 104   FEV6FVC-%Change-Post % 0   FEF 25-75 Pre L/sec 0.45   FEF2575-%Pred-Pre % 31   FEF 25-75 Post L/sec 0.60   FEF2575-%Pred-Post % 41   FEF2575-%Change-Post % 32   RV L 3.51   RV % pred % 178   TLC L 5.09   TLC % pred % 114   DLCO unc ml/min/mmHg 10.10   DLCO unc % pred % 59   DL/VA ml/min/mmHg/L 7.32   DL/VA % pred % 88    PHQ9 SCORE ONLY 05/10/2020 11/16/2019 10/20/2019  PHQ-9 Total Score 13 2 5    GAD 7 : Generalized Anxiety Score 05/10/2020 11/16/2019 10/20/2019 07/13/2019  Nervous, Anxious, on Edge 3 0 2 1  Control/stop worrying 2 1 1 1   Worry too much - different things 2 0 1 1  Trouble relaxing 2 0 1 1  Restless 1 0 0 0  Easily annoyed or irritable 2 0 0 0  Afraid - awful might happen 2 0 2 1  Total GAD 7 Score 14 1  7 5  Anxiety Difficulty - - - -         Assessment & Plan:  I personally reviewed all images and [facility] as well as any outside material available during this office visit and agree with the  radiology impressions.   Centrilobular emphysema (HCC)Gold B with asthma overlap syndrome Centrilobular emphysema with asthma overlap syndrome  Patient's HFA technique is poor she was actually exhaling when should she be inhaling and she was activating the inhaler twice for each breath in  I reinstructed to this proper HFA technique and improved from 10% deficiency up to 75%.  Patient received another course of prednisone should continue Advair and Spiriva as prescribed  I gave the patient a new AeroChamber spacer device  I recommended full pulmonary evaluation of the [place]  pulmonary clinic and she agrees we will make this referral   [place]ia was seen today for follow-up.  Diagnoses and all orders for this visit:  Centrilobular emphysema (HCC)Gold B with asthma overlap syndrome -     Ambulatory referral to Pulmonology  Sarcoidosis -     Ambulatory referral to Pulmonology  Other orders -     losartan-hydrochlorothiazide (HYZAAR) 100-25 MG tablet; Take 1 tablet by mouth daily. -     predniSONE (DELTASONE) 10 MG tablet; Take 4 tablets daily for 5 days then stop -     fluticasone-salmeterol (ADVAIR HFA) 230-21 MCG/ACT inhaler; Inhale 2 puffs into the lungs 2 (two) times daily.

## 2020-05-10 ENCOUNTER — Encounter: Payer: Self-pay | Admitting: Critical Care Medicine

## 2020-05-10 ENCOUNTER — Ambulatory Visit: Payer: Medicare Other | Attending: Critical Care Medicine | Admitting: Critical Care Medicine

## 2020-05-10 VITALS — BP 160/80 | HR 81 | Resp 20 | Ht 60.0 in | Wt 211.6 lb

## 2020-05-10 DIAGNOSIS — D869 Sarcoidosis, unspecified: Secondary | ICD-10-CM

## 2020-05-10 DIAGNOSIS — J432 Centrilobular emphysema: Secondary | ICD-10-CM | POA: Diagnosis not present

## 2020-05-10 LAB — CMP14+EGFR
ALT: 13 IU/L (ref 0–32)
AST: 7 IU/L (ref 0–40)
Albumin/Globulin Ratio: 1.5 (ref 1.2–2.2)
Albumin: 4.1 g/dL (ref 3.8–4.8)
Alkaline Phosphatase: 83 IU/L (ref 44–121)
BUN/Creatinine Ratio: 17 (ref 12–28)
BUN: 14 mg/dL (ref 8–27)
Bilirubin Total: 0.4 mg/dL (ref 0.0–1.2)
CO2: 24 mmol/L (ref 20–29)
Calcium: 9.4 mg/dL (ref 8.7–10.3)
Chloride: 105 mmol/L (ref 96–106)
Creatinine, Ser: 0.82 mg/dL (ref 0.57–1.00)
Globulin, Total: 2.8 g/dL (ref 1.5–4.5)
Glucose: 103 mg/dL — ABNORMAL HIGH (ref 65–99)
Potassium: 4.4 mmol/L (ref 3.5–5.2)
Sodium: 147 mmol/L — ABNORMAL HIGH (ref 134–144)
Total Protein: 6.9 g/dL (ref 6.0–8.5)
eGFR: 77 mL/min/{1.73_m2} (ref >59)

## 2020-05-10 LAB — CBC
Hematocrit: 46.1 % (ref 34.0–46.6)
Hemoglobin: 14.9 g/dL (ref 11.1–15.9)
MCH: 27.2 pg (ref 26.6–33.0)
MCHC: 32.3 g/dL (ref 31.5–35.7)
MCV: 84 fL (ref 79–97)
Platelets: 293 10*3/uL (ref 150–450)
RBC: 5.48 x10E6/uL — ABNORMAL HIGH (ref 3.77–5.28)
RDW: 13 % (ref 11.7–15.4)
WBC: 12.2 10*3/uL — ABNORMAL HIGH (ref 3.4–10.8)

## 2020-05-10 LAB — LIPID PANEL
Chol/HDL Ratio: 3 ratio (ref 0.0–4.4)
Cholesterol, Total: 149 mg/dL (ref 100–199)
HDL: 49 mg/dL (ref >39)
LDL Chol Calc (NIH): 87 mg/dL (ref 0–99)
Triglycerides: 65 mg/dL (ref 0–149)
VLDL Cholesterol Cal: 13 mg/dL (ref 5–40)

## 2020-05-10 LAB — HEMOGLOBIN A1C
Est. average glucose Bld gHb Est-mCnc: 146 mg/dL
Hgb A1c MFr Bld: 6.7 % — ABNORMAL HIGH (ref 4.8–5.6)

## 2020-05-10 MED ORDER — LOSARTAN POTASSIUM-HCTZ 100-25 MG PO TABS: 1.0000 | ORAL_TABLET | Freq: Every day | ORAL | 3 refills | Status: DC

## 2020-05-10 MED ORDER — ADVAIR HFA 230-21 MCG/ACT IN AERO: 2.0000 | INHALATION_SPRAY | Freq: Two times a day (BID) | RESPIRATORY_TRACT | 2 refills | Status: DC

## 2020-05-10 MED ORDER — PREDNISONE 10 MG PO TABS: ORAL_TABLET | ORAL | 0 refills | Status: DC

## 2020-05-10 NOTE — Assessment & Plan Note (Signed)
Centrilobular emphysema with asthma overlap syndrome  Patient's HFA technique is poor she was actually exhaling when should she be inhaling and she was activating the inhaler twice for each breath in  I reinstructed to this proper HFA technique and improved from 10% deficiency up to 75%.  Patient received another course of prednisone should continue Advair and Spiriva as prescribed  I gave the patient a new AeroChamber spacer device  I recommended full pulmonary evaluation of the [place]  pulmonary clinic and she agrees we will make this referral

## 2020-05-10 NOTE — Patient Instructions (Signed)
Resume prednisone 10mg :  Take 4 tablets daily for 5 days then stop  Take inhalers as I demonstrated to you  Use spacer for inhalers  Increase losartan HCT to 100/25 one daily  A referral to [facility] will be made with Dr Icard.  I will introduce you to him through our system  Keep upcoming appts with Ms Nida Boatman

## 2020-05-11 MED ORDER — AZITHROMYCIN 250 MG PO TABS
ORAL_TABLET | ORAL | 0 refills | Status: DC
Start: 2020-05-11 — End: 2020-07-20

## 2020-05-12 ENCOUNTER — Other Ambulatory Visit: Payer: Self-pay | Admitting: Nurse Practitioner

## 2020-05-12 MED ORDER — METFORMIN HCL 500 MG PO TABS: 500.0000 mg | ORAL_TABLET | Freq: Every day | ORAL | 0 refills | Status: DC

## 2020-05-13 ENCOUNTER — Encounter: Payer: Self-pay | Admitting: Nurse Practitioner

## 2020-06-08 ENCOUNTER — Other Ambulatory Visit: Payer: Self-pay | Admitting: Critical Care Medicine

## 2020-06-08 MED ORDER — ADVAIR HFA 230-21 MCG/ACT IN AERO: 2.0000 | INHALATION_SPRAY | Freq: Two times a day (BID) | RESPIRATORY_TRACT | 2 refills | Status: DC

## 2020-06-08 MED ORDER — SPIRIVA RESPIMAT 2.5 MCG/ACT IN AERS
INHALATION_SPRAY | RESPIRATORY_TRACT | 11 refills | Status: DC
Start: 2020-06-08 — End: 2020-12-14

## 2020-06-08 NOTE — Telephone Encounter (Signed)
  Notes to clinic:  medication requested is not on current medication list  Review for refill    Requested Prescriptions  Pending Prescriptions Disp Refills   SPIRIVA RESPIMAT 2.5 MCG/ACT AERS [Pharmacy Med Name: SPIRIVA RESPIMAT 2.5MCG INH 4GM 60D] 4 g 11    Sig: INHALE 2 PUFFS BY MOUTH DAILY      Pulmonology:  Anticholinergic Agents Passed - 06/08/2020 10:47 AM      Passed - Valid encounter within last 12 months    Recent Outpatient Visits           4 weeks ago Centrilobular emphysema (HCC)Gold B with asthma overlap syndrome   [facility] Storm Frisk, MD   6 months ago Encounter to establish care   [facility] [place], [place] W, NP   7 months ago Centrilobular emphysema (HCC)Gold B with asthma overlap syndrome   [facility] Storm Frisk, MD   11 months ago Centrilobular emphysema (HCC)Gold B with asthma overlap syndrome   [facility] Storm Frisk, MD   1 year ago Centrilobular emphysema [facility])   [facility] Storm Frisk, MD       Future Appointments             In 1 month Claiborne Rigg, NP [facility]

## 2020-06-16 ENCOUNTER — Ambulatory Visit: Payer: Medicare Other | Admitting: Physician Assistant

## 2020-07-20 ENCOUNTER — Ambulatory Visit: Payer: Medicare Other | Attending: Nurse Practitioner | Admitting: Nurse Practitioner

## 2020-07-20 ENCOUNTER — Encounter: Payer: Self-pay | Admitting: Nurse Practitioner

## 2020-07-20 ENCOUNTER — Other Ambulatory Visit: Payer: Self-pay

## 2020-07-20 ENCOUNTER — Telehealth: Payer: Self-pay | Admitting: Nurse Practitioner

## 2020-07-20 DIAGNOSIS — J3089 Other allergic rhinitis: Secondary | ICD-10-CM | POA: Diagnosis not present

## 2020-07-20 DIAGNOSIS — Z6836 Body mass index (BMI) 36.0-36.9, adult: Secondary | ICD-10-CM | POA: Diagnosis not present

## 2020-07-20 DIAGNOSIS — E1165 Type 2 diabetes mellitus with hyperglycemia: Secondary | ICD-10-CM | POA: Diagnosis not present

## 2020-07-20 MED ORDER — LORATADINE 10 MG PO TABS: 10.0000 mg | ORAL_TABLET | Freq: Every day | ORAL | 11 refills | Status: DC

## 2020-07-20 NOTE — Progress Notes (Signed)
Virtual Visit via Telephone Note Due to national recommendations of social distancing due to COVID 19, telehealth visit is felt to be most appropriate for this patient at this time.  I discussed the limitations, risks, security and privacy concerns of performing an evaluation and management service by telephone and the availability of in person appointments. I also discussed with the patient that there may be a patient responsible charge related to this service. The patient expressed understanding and agreed to proceed.    I connected with Carolyn Morton on 07/20/20  at   9:50 AM EDT  EDT by telephone and verified that I am speaking with the correct person using two identifiers.  Location of Patient: Private Residence   Location of Provider: [facility]    Persons participating in Telemedicine visit: Bertram Denver FNP-BC [place]    History of Present Illness: Telemedicine visit for: Follow up She has a past medical history of COPD (chronic obstructive pulmonary disease) (HCC), Hypertension, and Sarcoidosis.   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   DM Not taking metformin. States she wants to work on her weight and declines starting.  Lab Results  Component Value Date   HGBA1C 6.7 (H) 05/09/2020    Chronic Back Pain She states she was supposed to have back surgery several years ago however it was not approved by her insurance company. States she had a client at work fall over on top of here several years ago and this precipitated her back injury. She would like referral to weight loss clinic and declines referral for orthopedics today. She does not take any medications for her back pain. States I just deal with it.    Allergies/Asthma She has chronic PND, morning headaches, nasal congestion and non productive cough at night despite using flonase and claritin. Would like referral to allergist. She states her blood pressure  at home is normal 130/80. BP Readings from Last 3 Encounters:  05/10/20 (!) 160/80  10/20/19 (!) 150/84  07/21/19 (!) 103/56       Past Medical History:  Diagnosis Date   COPD (chronic obstructive pulmonary disease) (HCC)    Hypertension    Sarcoidosis     Past Surgical History:  Procedure Laterality Date   CHOLECYSTECTOMY N/A 07/19/2019   Procedure: LAPAROSCOPIC CHOLECYSTECTOMY WITH INTRAOPERATIVE CHOLANGIOGRAM;  Surgeon: Gaynelle Adu, MD;  Location: [facility] OR;  Service: General;  Laterality: N/A;    Family History  Family history unknown: Yes    Social History   Socioeconomic History   Marital status: Widowed    Spouse name: Not on file   Number of children: Not on file   Years of education: Not on file   Highest education level: Not on file  Occupational History   Not on file  Tobacco Use   Smoking status: Former    Pack years: 0.00    Types: Cigarettes    Quit date: 01/22/2010    Years since quitting: 10.4   Smokeless tobacco: Never  Vaping Use   Vaping Use: Former  Substance and Sexual Activity   Alcohol use: Not Currently   Drug use: Never   Sexual activity: Not on file  Other Topics Concern   Not on file  Social History Narrative   Not on file   Social Determinants of Health   Financial Resource Strain: Not on file  Food Insecurity: Not on file  Transportation Needs: Not on file  Physical Activity: Not on file  Stress: Not on file  Social Connections: Not on file     Observations/Objective: Awake, alert and oriented x 3   Review of Systems  Constitutional:  Negative for fever, malaise/fatigue and weight loss.  HENT:  Positive for congestion. Negative for nosebleeds.   Eyes: Negative.  Negative for blurred vision, double vision and photophobia.  Respiratory:  Positive for cough. Negative for sputum production, shortness of breath and wheezing.   Cardiovascular: Negative.  Negative for chest pain, palpitations and leg swelling.  Gastrointestinal:  Negative.  Negative for heartburn, nausea and vomiting.  Musculoskeletal:  Positive for back pain. Negative for myalgias.  Neurological:  Positive for headaches. Negative for dizziness, focal weakness and seizures.  Endo/Heme/Allergies:  Positive for environmental allergies.  Psychiatric/Behavioral: Negative.  Negative for suicidal ideas.    Assessment and Plan: Diagnoses and all orders for this visit:  Type 2 diabetes mellitus with hyperglycemia, without long-term current use of insulin (HCC) Continue blood sugar control as discussed in office today, low carbohydrate diet, and regular physical exercise as tolerated, 150 minutes per week (30 min each day, 5 days per week, or 50 min 3 days per week). Keep blood sugar logs with fasting goal of 90-130 mg/dl, post prandial (after you eat) less than 180.  For Hypoglycemia: BS <60 and Hyperglycemia BS >400; contact the clinic ASAP. Annual eye exams and foot exams are recommended.   Class 2 severe obesity due to excess calories with serious comorbidity and body mass index (BMI) of 36.0 to 36.9 in adult (HCC) -     Amb Ref to Medical Weight Management  Environmental and seasonal allergies -     loratadine (CLARITIN) 10 MG tablet; Take 1 tablet (10 mg total) by mouth daily. -     Ambulatory referral to Allergy    Follow Up Instructions Return in about 3 months (around 10/20/2020).     I discussed the assessment and treatment plan with the patient. The patient was provided an opportunity to ask questions and all were answered. The patient agreed with the plan and demonstrated an understanding of the instructions.   The patient was advised to call back or seek an in-person evaluation if the symptoms worsen or if the condition fails to improve as anticipated.  I provided 20 minutes of non-face-to-face time during this encounter including median intraservice time, reviewing previous notes, labs, imaging, medications and explaining diagnosis and  management.  Claiborne Rigg, FNP-BC

## 2020-07-20 NOTE — Telephone Encounter (Signed)
No answer. LVM to return call to office today

## 2020-07-20 NOTE — Telephone Encounter (Signed)
Called mobile and home number. No answer. LVM to return call to office

## 2020-07-28 ENCOUNTER — Encounter: Payer: Self-pay | Admitting: Nurse Practitioner

## 2020-09-01 DIAGNOSIS — J432 Centrilobular emphysema: Secondary | ICD-10-CM

## 2020-09-01 DIAGNOSIS — D869 Sarcoidosis, unspecified: Secondary | ICD-10-CM

## 2020-10-02 ENCOUNTER — Ambulatory Visit: Payer: Medicare Other

## 2020-10-09 ENCOUNTER — Ambulatory Visit: Payer: Medicare Other

## 2020-10-13 ENCOUNTER — Encounter: Payer: Self-pay | Admitting: Allergy & Immunology

## 2020-10-13 ENCOUNTER — Ambulatory Visit: Payer: Medicare Other | Admitting: Allergy & Immunology

## 2020-10-13 ENCOUNTER — Ambulatory Visit: Payer: Medicare Other | Admitting: Pulmonary Disease

## 2020-10-13 ENCOUNTER — Encounter: Payer: Self-pay | Admitting: Pulmonary Disease

## 2020-10-13 ENCOUNTER — Other Ambulatory Visit: Payer: Self-pay

## 2020-10-13 ENCOUNTER — Institutional Professional Consult (permissible substitution): Payer: Medicare Other | Admitting: Pulmonary Disease

## 2020-10-13 VITALS — BP 126/80 | HR 109 | Temp 98.0°F | Ht 60.0 in | Wt 212.0 lb

## 2020-10-13 VITALS — BP 148/82 | HR 90 | Temp 98.2°F | Resp 20 | Ht 60.0 in | Wt 212.4 lb

## 2020-10-13 DIAGNOSIS — J449 Chronic obstructive pulmonary disease, unspecified: Secondary | ICD-10-CM

## 2020-10-13 DIAGNOSIS — Z87891 Personal history of nicotine dependence: Secondary | ICD-10-CM

## 2020-10-13 DIAGNOSIS — J3089 Other allergic rhinitis: Secondary | ICD-10-CM | POA: Diagnosis not present

## 2020-10-13 DIAGNOSIS — J302 Other seasonal allergic rhinitis: Secondary | ICD-10-CM | POA: Diagnosis not present

## 2020-10-13 DIAGNOSIS — R0902 Hypoxemia: Secondary | ICD-10-CM

## 2020-10-13 DIAGNOSIS — D869 Sarcoidosis, unspecified: Secondary | ICD-10-CM

## 2020-10-13 MED ORDER — BREZTRI AEROSPHERE 160-9-4.8 MCG/ACT IN AERO: 2.0000 | INHALATION_SPRAY | Freq: Two times a day (BID) | RESPIRATORY_TRACT | 0 refills | Status: DC

## 2020-10-13 MED ORDER — IPRATROPIUM BROMIDE 0.03 % NA SOLN
2.0000 | Freq: Three times a day (TID) | NASAL | 5 refills | Status: AC
Start: 2020-10-13 — End: ?

## 2020-10-13 MED ORDER — CETIRIZINE HCL 10 MG PO TABS: 10.0000 mg | ORAL_TABLET | Freq: Two times a day (BID) | ORAL | 5 refills | Status: AC | PRN

## 2020-10-13 NOTE — Patient Instructions (Addendum)
1. Chronic obstructive pulmonary disease - Lung testing was in the 38% range or so. - I did not make any changes since you are seeing Dr. Tonia Brooms this afternoon. - I will route my note to keep him on the same page.   2. Chronic rhinitis - Testing today showed: grasses, weeds, trees, indoor molds, outdoor molds, and dust mites - Copy of test results provided.  - Avoidance measures provided. - Stop taking: Flonase - Continue with: Zyrtec (cetirizine) 10mg  tablet once daily - Start taking: Atrovent one spray per nostril up to three times daily (start low and go slow since it can be overdrying).  - You can use an extra dose of the antihistamine, if needed, for breakthrough symptoms.  - Consider nasal saline rinses 1-2 times daily to remove allergens from the nasal cavities as well as help with mucous clearance (this is especially helpful to do before the nasal sprays are given) - Consider allergy shots as a means of long-term control. - Allergy shots "re-train" and "reset" the immune system to ignore environmental allergens and decrease the resulting immune response to those allergens (sneezing, itchy watery eyes, runny nose, nasal congestion, etc).    - Allergy shots improve symptoms in 75-85% of patients.  - We can discuss more at the next appointment if the medications are not working for you. - I also want your breathing under better control first.  2. Return in about 6 weeks (around 11/24/2020).    Please inform 13/03/2020 of any Emergency Department visits, hospitalizations, or changes in symptoms. Call us before going to the ED for breathing or allergy symptoms since we might be able to fit you in for a sick visit. Feel free to contact us anytime with any questions, problems, or concerns.  It was a pleasure to meet you today!  Websites that have reliable patient information: 1. American Academy of Asthma, Allergy, and Immunology: www.aaaai.org 2. Food Allergy Research and Education (FARE):  foodallergy.org 3. Mothers of Asthmatics: http://www.asthmacommunitynetwork.org 4. American College of Allergy, Asthma, and Immunology: www.acaai.org   COVID-19 Vaccine Information can be found at: [place] For questions related to vaccine distribution or appointments, please email [email or web address] or call [phone].   We realize that you might be concerned about having an allergic reaction to the COVID19 vaccines. To help with that concern, WE ARE OFFERING THE COVID19 VACCINES IN OUR OFFICE! Ask the front desk for dates!     "Like" [phone] on Facebook and Instagram for our latest updates!      A healthy democracy works [place] when [place] participate! Make sure you are registered to vote! If you have moved or changed any of your contact information, you will need to get this updated before voting!  In some cases, you MAY be able to register to vote online: [employer]    1. Control-Buffer 50% Glycerol Negative   2. Control-Histamine 1 mg/ml 2+   3. Albumin saline Negative   4. Bahia Negative   5. AromatherapyCrystals.be Negative   6. Johnson Negative   7. Kentucky Blue Negative   8. Meadow Fescue 2+   9. Perennial Rye 2+   10. Sweet Vernal Negative   11. Timothy Negative   12. Cocklebur Negative   13. Burweed Marshelder Negative   14. Ragweed, short Negative   15. Ragweed, Giant Negative   16. Plantain,  English Negative   17. Lamb's Quarters Negative   18. Sheep Sorrell 2+   19. Rough Pigweed Negative   20. [place],  Rough Negative   21. Mugwort, Common Negative   22. Ash mix Negative   23. Birch mix Negative   24. Beech American Negative   25. Box, Elder 2+   26. Cedar, red Negative   27. Cottonwood, Guinea[place]issau Negative   28. Elm mix Negative   29. Hickory Negative   30. Maple mix Negative   31. Oak, Guinea[place] mix Negative   32. Pecan Pollen Negative   33. Pine mix  Negative   34. Sycamore Eastern Negative   35. Walnut, Black Pollen Negative   36. Alternaria alternata Negative   37. Cladosporium Herbarum Negative   38. Aspergillus mix Negative   39. Penicillium mix Negative   40. Bipolaris sorokiniana (Helminthosporium) Negative   41. Drechslera spicifera (Curvularia) Negative   42. Mucor plumbeus Negative   43. Fusarium moniliforme Negative   44. Aureobasidium pullulans (pullulara) Negative   45. Rhizopus oryzae Negative   46. Botrytis cinera Negative   47. Epicoccum nigrum Negative   48. Phoma betae Negative   49. Candida Albicans Negative   50. Trichophyton mentagrophytes Negative   51. Mite, D Farinae  5,000 AU/ml Negative   52. Mite, D Pteronyssinus  5,000 AU/ml Negative   53. Cat Hair 10,000 BAU/ml Negative   54.  Dog Epithelia Negative   55. Mixed Feathers Negative   56. Horse Epithelia Negative   57. Cockroach, German Negative   58. Mouse Negative   59. Tobacco Leaf Negative     Number of Test 13   Intradermal Select   Control Negative   [place] Negative   Johnson 2+   Ragweed mix Negative   Tree mix 1+   Mold 1 1+   Mold 2 2+   Mold 3 2+   Mold 4 3+   Cat Negative   Dog Negative   Cockroach Negative   Mite mix 1+     Reducing Pollen Exposure  The American Academy of Allergy, Asthma and Immunology suggests the following steps to reduce your exposure to pollen during allergy seasons.    Do not hang sheets or clothing out to dry; pollen may collect on these items. Do not mow lawns or spend time around freshly cut grass; mowing stirs up pollen. Keep windows closed at night.  Keep car windows closed while driving. Minimize morning activities outdoors, a time when pollen counts are usually at their highest. Stay indoors as much as possible when pollen counts or humidity is high and on windy days when pollen tends to remain in the air longer. Use air conditioning when possible.  Many air conditioners have filters that  trap the pollen spores. Use a HEPA room air filter to remove pollen form the indoor air you breathe.  Control of Mold Allergen   Mold and fungi can grow on a variety of surfaces provided certain temperature and moisture conditions exist.  Outdoor molds grow on plants, decaying vegetation and soil.  The major outdoor mold, Alternaria and Cladosporium, are found in very high numbers during hot and dry conditions.  Generally, a late Summer - Fall peak is seen for common outdoor fungal spores.  Rain will temporarily lower outdoor mold spore count, but counts rise rapidly when the rainy period ends.  The most important indoor molds are Aspergillus and Penicillium.  Dark, humid and poorly ventilated basements are ideal sites for mold growth.  The next most common sites of mold growth are the bathroom and the kitchen.  Outdoor (Seasonal) Mold Control  Positive outdoor  molds via skin testing: Alternaria, Cladosporium, Bipolaris (Helminthsporium), Drechslera (Curvalaria), and Mucor  Use air conditioning and keep windows closed Avoid exposure to decaying vegetation. Avoid leaf raking. Avoid grain handling. Consider wearing a face mask if working in moldy areas.    Indoor (Perennial) Mold Control   Positive indoor molds via skin testing: Aspergillus, Penicillium, Fusarium, Aureobasidium (Pullulara), and Rhizopus  Maintain humidity below 50%. Clean washable surfaces with 5% bleach solution. Remove sources e.g. contaminated carpets.    Control of Dust Mite Allergen    Dust mites play a major role in allergic asthma and rhinitis.  They occur in environments with high humidity wherever human skin is found.  Dust mites absorb humidity from the atmosphere (ie, they do not drink) and feed on organic matter (including shed human and animal skin).  Dust mites are a microscopic type of insect that you cannot see with the naked eye.  High levels of dust mites have been detected from mattresses, pillows,  carpets, upholstered furniture, bed covers, clothes, soft toys and any woven material.  The principal allergen of the dust mite is found in its feces.  A gram of dust may contain 1,000 mites and 250,000 fecal particles.  Mite antigen is easily measured in the air during house cleaning activities.  Dust mites do not bite and do not cause harm to humans, other than by triggering allergies/asthma.    Ways to decrease your exposure to dust mites in your home:  Encase mattresses, box springs and pillows with a mite-impermeable barrier or cover   Wash sheets, blankets and drapes weekly in hot water (130 F) with detergent and dry them in a dryer on the hot setting.  Have the room cleaned frequently with a vacuum cleaner and a damp dust-mop.  For carpeting or rugs, vacuuming with a vacuum cleaner equipped with a high-efficiency particulate air (HEPA) filter.  The dust mite allergic individual should not be in a room which is being cleaned and should wait 1 hour after cleaning before going into the room. Do not sleep on upholstered furniture (eg, couches).   If possible removing carpeting, upholstered furniture and drapery from the home is ideal.  Horizontal blinds should be eliminated in the rooms where the person spends the most time (bedroom, study, television room).  Washable vinyl, roller-type shades are optimal. Remove all non-washable stuffed toys from the bedroom.  Wash stuffed toys weekly like sheets and blankets above.   Reduce indoor humidity to less than 50%.  Inexpensive humidity monitors can be purchased at most hardware stores.  Do not use a humidifier as can make the problem worse and are not recommended.  Allergy Shots   Allergies are the result of a chain reaction that starts in the immune system. Your immune system controls how your body defends itself. For instance, if you have an allergy to pollen, your immune system identifies pollen as an invader or allergen. Your immune system overreacts  by producing antibodies called Immunoglobulin E (IgE). These antibodies travel to cells that release chemicals, causing an allergic reaction.  The concept behind allergy immunotherapy, whether it is received in the form of shots or tablets, is that the immune system can be desensitized to specific allergens that trigger allergy symptoms. Although it requires time and patience, the payback can be long-term relief.  How Do Allergy Shots Work?  Allergy shots work much like a vaccine. Your body responds to injected amounts of a particular allergen given in increasing doses, eventually developing a  resistance and tolerance to it. Allergy shots can lead to decreased, minimal or no allergy symptoms.  There generally are two phases: build-up and maintenance. Build-up often ranges from three to six months and involves receiving injections with increasing amounts of the allergens. The shots are typically given once or twice a week, though more rapid build-up schedules are sometimes used.  The maintenance phase begins when the most effective dose is reached. This dose is different for each person, depending on how allergic you are and your response to the build-up injections. Once the maintenance dose is reached, there are longer periods between injections, typically two to four weeks.  Occasionally doctors give cortisone-type shots that can temporarily reduce allergy symptoms. These types of shots are different and should not be confused with allergy immunotherapy shots.  Who Can Be Treated with Allergy Shots?  Allergy shots may be a good treatment approach for people with allergic rhinitis (hay fever), allergic asthma, conjunctivitis (eye allergy) or stinging insect allergy.   Before deciding to begin allergy shots, you should consider:   The length of allergy season and the severity of your symptoms  Whether medications and/or changes to your environment can control your symptoms  Your desire to avoid  long-term medication use  Time: allergy immunotherapy requires a major time commitment  Cost: may vary depending on your insurance coverage  Allergy shots for children age 21 and older are effective and often well tolerated. They might prevent the onset of new allergen sensitivities or the progression to asthma.  Allergy shots are not started on patients who are pregnant but can be continued on patients who become pregnant while receiving them. In some patients with other medical conditions or who take certain common medications, allergy shots may be of risk. It is important to mention other medications you talk to your allergist.   When Will I Feel Better?  Some may experience decreased allergy symptoms during the build-up phase. For others, it may take as long as 12 months on the maintenance dose. If there is no improvement after a year of maintenance, your allergist will discuss other treatment options with you.  If you aren't responding to allergy shots, it may be because there is not enough dose of the allergen in your vaccine or there are missing allergens that were not identified during your allergy testing. Other reasons could be that there are high levels of the allergen in your environment or major exposure to non-allergic triggers like tobacco smoke.  What Is the Length of Treatment?  Once the maintenance dose is reached, allergy shots are generally continued for three to five years. The decision to stop should be discussed with your allergist at that time. Some people may experience a permanent reduction of allergy symptoms. Others may relapse and a longer course of allergy shots can be considered.  What Are the Possible Reactions?  The two types of adverse reactions that can occur with allergy shots are local and systemic. Common local reactions include very mild redness and swelling at the injection site, which can happen immediately or several hours after. A systemic reaction,  which is less common, affects the entire body or a particular body system. They are usually mild and typically respond quickly to medications. Signs include increased allergy symptoms such as sneezing, a stuffy nose or hives.  Rarely, a serious systemic reaction called anaphylaxis can develop. Symptoms include swelling in the throat, wheezing, a feeling of tightness in the chest, nausea or dizziness. Most serious  systemic reactions develop within 30 minutes of allergy shots. This is why it is strongly recommended you wait in your doctor's office for 30 minutes after your injections. Your allergist is trained to watch for reactions, and his or her staff is trained and equipped with the proper medications to identify and treat them.  Who Should Administer Allergy Shots?  The preferred location for receiving shots is your prescribing allergist's office. Injections can sometimes be given at another facility where the physician and staff are trained to recognize and treat reactions, and have received instructions by your prescribing allergist.

## 2020-10-13 NOTE — Progress Notes (Signed)
Synopsis: Referred in September 2022 for possible sarcoidosis by Storm Frisk, MD  Subjective:   PATIENT ID: Carolyn Morton GENDER: female DOB: 1950-02-23, MRN: [id number]  Chief Complaint  Patient presents with   Consult    Referred by Dr. Delford Field at [facility] for emphysema and sarcoidosis.      This is a 70 year old female, past medical history of COPD, hypertension, sarcoidosis. Patient last seen in the office by Dr. Dellis Anes at allergy and asthma, COPD with FEV1 38%.  Managed for chronic rhinitis started on antihistamines.  Last seen by Dr. Delford Field in April 2022, office note reviewed.  Felt to have asthma COPD overlap with evidence of centrilobular emphysema. Stopped smoking in 2012. Smoked for 25+ years. 0.5-1 ppd. Doesn't use O2 at this time. Currently on advair and spiriva. Occasionally using albuterol. She was diagnosed with sarcoidosis, 1974. Family history of sarcoidosis. She was followed up and told that everything looked fine.  From respiratory standpoint she has an mMRC of 3.  Minimal exertion causes shortness of breath.  She can sit rest and her symptoms resolved but she has difficulty walking from her house to the car.  Doing minimal activities at home she has to stop sit and rest.    Past Medical History:  Diagnosis Date   COPD (chronic obstructive pulmonary disease) (HCC)    Hypertension    Sarcoidosis      Family History  Family history unknown: Yes     Past Surgical History:  Procedure Laterality Date   CHOLECYSTECTOMY N/A 07/19/2019   Procedure: LAPAROSCOPIC CHOLECYSTECTOMY WITH INTRAOPERATIVE CHOLANGIOGRAM;  Surgeon: Gaynelle Adu, MD;  Location: [facility] OR;  Service: General;  Laterality: N/A;    Social History   Socioeconomic History   Marital status: Widowed    Spouse name: Not on file   Number of children: Not on file   Years of education: Not on file   Highest education level: Not on file  Occupational History   Not on file  Tobacco Use   Smoking status:  Former    Types: Cigarettes    Quit date: 01/22/2010    Years since quitting: 10.7   Smokeless tobacco: Never  Vaping Use   Vaping Use: Former  Substance and Sexual Activity   Alcohol use: Not Currently   Drug use: Never   Sexual activity: Not on file  Other Topics Concern   Not on file  Social History Narrative   Not on file   Social Determinants of Health   Financial Resource Strain: Not on file  Food Insecurity: Not on file  Transportation Needs: Not on file  Physical Activity: Not on file  Stress: Not on file  Social Connections: Not on file  Intimate Partner Violence: Not on file     No Active Allergies   Outpatient Medications Prior to Visit  Medication Sig Dispense Refill   acetaminophen (TYLENOL) 500 MG tablet Take 2 tablets (1,000 mg total) by mouth every 8 (eight) hours as needed. 30 tablet 0   albuterol (PROVENTIL) (2.5 MG/3ML) 0.083% nebulizer solution Take 3 mLs (2.5 mg total) by nebulization every 6 (six) hours as needed for wheezing or shortness of breath. 75 mL 0   albuterol (VENTOLIN HFA) 108 (90 Base) MCG/ACT inhaler Inhale 2 puffs into the lungs every 6 (six) hours as needed for wheezing or shortness of breath. 18 g 1   aspirin 81 MG chewable tablet Chew 81 mg by mouth as needed (for general health).     cetirizine (  ZYRTEC) 10 MG tablet Take 1 tablet (10 mg total) by mouth 2 (two) times daily as needed for allergies (During flare ups). 60 tablet 5   DULoxetine (CYMBALTA) 60 MG capsule Take 1 capsule (60 mg total) by mouth daily. 30 capsule 3   fluticasone (FLONASE) 50 MCG/ACT nasal spray Place 2 sprays into both nostrils daily. 16 g 6   fluticasone-salmeterol (ADVAIR HFA) 230-21 MCG/ACT inhaler Inhale 2 puffs into the lungs 2 (two) times daily. 12 g 2   ipratropium (ATROVENT) 0.03 % nasal spray Place 2 sprays into both nostrils 3 (three) times daily. 30 mL 5   loratadine (CLARITIN) 10 MG tablet Take 1 tablet (10 mg total) by mouth daily. 30 tablet 11    losartan-hydrochlorothiazide (HYZAAR) 100-25 MG tablet Take 1 tablet by mouth daily. 90 tablet 3   Tiotropium Bromide Monohydrate (SPIRIVA RESPIMAT) 2.5 MCG/ACT AERS Two puff daily 4 g 11   tiZANidine (ZANAFLEX) 4 MG tablet 1/2 - 1 tablet every 6 hours as needed for muscle spasm 30 tablet 2   No facility-administered medications prior to visit.    Review of Systems  Constitutional:  Negative for chills, fever, malaise/fatigue and weight loss.  HENT:  Negative for hearing loss, sore throat and tinnitus.   Eyes:  Negative for blurred vision and double vision.  Respiratory:  Positive for shortness of breath. Negative for cough, hemoptysis, sputum production, wheezing and stridor.   Cardiovascular:  Negative for chest pain, palpitations, orthopnea, leg swelling and PND.  Gastrointestinal:  Negative for abdominal pain, constipation, diarrhea, heartburn, nausea and vomiting.  Genitourinary:  Negative for dysuria, hematuria and urgency.  Musculoskeletal:  Negative for joint pain and myalgias.  Skin:  Negative for itching and rash.  Neurological:  Negative for dizziness, tingling, weakness and headaches.  Endo/Heme/Allergies:  Negative for environmental allergies. Does not bruise/bleed easily.  Psychiatric/Behavioral:  Negative for depression. The patient is not nervous/anxious and does not have insomnia.   All other systems reviewed and are negative.   Objective:  Physical Exam Vitals reviewed.  Constitutional:      General: She is not in acute distress.    Appearance: She is well-developed. She is obese.  HENT:     Head: Normocephalic and atraumatic.  Eyes:     General: No scleral icterus.    Conjunctiva/sclera: Conjunctivae normal.     Pupils: Pupils are equal, round, and reactive to light.  Neck:     Vascular: No JVD.     Trachea: No tracheal deviation.  Cardiovascular:     Rate and Rhythm: Normal rate and regular rhythm.     Heart sounds: Normal heart sounds. No murmur  heard. Pulmonary:     Effort: Pulmonary effort is normal. No tachypnea, accessory muscle usage or respiratory distress.     Breath sounds: No stridor. No wheezing, rhonchi or rales.     Comments: Diminished breath sounds bilaterally no crackles no wheeze Abdominal:     General: Bowel sounds are normal. There is no distension.     Palpations: Abdomen is soft.     Tenderness: There is no abdominal tenderness.  Musculoskeletal:        General: No tenderness.     Cervical back: Neck supple.     Right lower leg: No edema.     Left lower leg: No edema.  Lymphadenopathy:     Cervical: No cervical adenopathy.  Skin:    General: Skin is warm and dry.     Capillary Refill: Capillary refill  takes less than 2 seconds.     Findings: No rash.  Neurological:     Mental Status: She is alert and oriented to person, place, and time.  Psychiatric:        Behavior: Behavior normal.     Vitals:   10/13/20 1440  BP: 126/80  Pulse: (!) 109  Temp: 98 F (36.7 C)  TempSrc: Oral  SpO2: 91%  Weight: 212 lb (96.2 kg)  Height: 5' (1.524 m)   91% on  RA BMI Readings from Last 3 Encounters:  10/13/20 41.40 kg/m  10/13/20 41.48 kg/m  05/10/20 41.33 kg/m   Wt Readings from Last 3 Encounters:  10/13/20 212 lb (96.2 kg)  10/13/20 212 lb 6 oz (96.3 kg)  05/10/20 211 lb 9.6 oz (96 kg)     CBC    Component Value Date/Time   WBC 12.2 (H) 05/09/2020 0905   WBC 9.9 07/20/2019 0237   RBC 5.48 (H) 05/09/2020 0905   RBC 4.83 07/20/2019 0237   HGB 14.9 05/09/2020 0905   HCT 46.1 05/09/2020 0905   PLT 293 05/09/2020 0905   MCV 84 05/09/2020 0905   MCH 27.2 05/09/2020 0905   MCH 27.3 07/20/2019 0237   MCHC 32.3 05/09/2020 0905   MCHC 31.0 07/20/2019 0237   RDW 13.0 05/09/2020 0905     Chest Imaging: No recent chest imaging  June 2021 chest x-ray, no infiltrate no acute process. Basilar atelectasis. The patient's images have been independently reviewed by me.    Pulmonary Functions  Testing Results: PFT Results Latest Ref Rng & Units 05/11/2019  FVC-Pre L 1.26  FVC-Predicted Pre % 64  FVC-Post L 1.32  FVC-Predicted Post % 67  Pre FEV1/FVC % % 68  Post FEV1/FCV % % 69  FEV1-Pre L 0.86  FEV1-Predicted Pre % 56  FEV1-Post L 0.92  DLCO uncorrected ml/min/mmHg 10.10  DLCO UNC% % 59  DLVA Predicted % 88  TLC L 5.09  TLC % Predicted % 114  RV % Predicted % 178    FeNO:   Pathology:   Echocardiogram:   Heart Catheterization:     Assessment & Plan:     ICD-10-CM   1. Hypoxemia  R09.02     2. Stage 3 severe COPD by GOLD classification (HCC)  J44.9 Ambulatory Referral for Lung Cancer Scre    3. Former smoker  Z87.891 Ambulatory Referral for Lung Cancer Scre      Discussion: This is a 70 year old obese female, severe COPD FEV1 of 38% on previous office spirometry.  She is a former smoker quit in 2012.  She has significant symptoms with mMRC of 3.  Able to complete activities of daily living but has much difficulty with minimal exertion.  She is currently on triple therapy.  Plan: Think should benefit from repeat PFTs. I also think she needs oxygen with exertion. We will qualify her today in the office. Hopefully we can get her a POC as well.  New DME supply orders for O2. At rest her sat was 91 today in the office.  We will give her Breztri samples today as well as a new prescription. She is to hold her Advair and Spiriva at this time. Will see if she notices any difference switching to an MDI.  With sarcoid history at some point can consider CT imaging of the chest.  I think we can hold off on this right now.  Has not had an issue since 1974.  She has not had any  issues since 1974 that she knows of.  She quit smoking 10 years ago, therefore qualifies for lung cancer screening CT.  At her max was smoking 1 pack/day for 25+ years.  Return to clinic after PFTs complete and see how she is doing with symptom management on new inhaler regimen in  approximately 3 to 4 weeks.   Current Outpatient Medications:    acetaminophen (TYLENOL) 500 MG tablet, Take 2 tablets (1,000 mg total) by mouth every 8 (eight) hours as needed., Disp: 30 tablet, Rfl: 0   albuterol (PROVENTIL) (2.5 MG/3ML) 0.083% nebulizer solution, Take 3 mLs (2.5 mg total) by nebulization every 6 (six) hours as needed for wheezing or shortness of breath., Disp: 75 mL, Rfl: 0   albuterol (VENTOLIN HFA) 108 (90 Base) MCG/ACT inhaler, Inhale 2 puffs into the lungs every 6 (six) hours as needed for wheezing or shortness of breath., Disp: 18 g, Rfl: 1   aspirin 81 MG chewable tablet, Chew 81 mg by mouth as needed (for general health)., Disp: , Rfl:    cetirizine (ZYRTEC) 10 MG tablet, Take 1 tablet (10 mg total) by mouth 2 (two) times daily as needed for allergies (During flare ups)., Disp: 60 tablet, Rfl: 5   DULoxetine (CYMBALTA) 60 MG capsule, Take 1 capsule (60 mg total) by mouth daily., Disp: 30 capsule, Rfl: 3   fluticasone (FLONASE) 50 MCG/ACT nasal spray, Place 2 sprays into both nostrils daily., Disp: 16 g, Rfl: 6   fluticasone-salmeterol (ADVAIR HFA) 230-21 MCG/ACT inhaler, Inhale 2 puffs into the lungs 2 (two) times daily., Disp: 12 g, Rfl: 2   ipratropium (ATROVENT) 0.03 % nasal spray, Place 2 sprays into both nostrils 3 (three) times daily., Disp: 30 mL, Rfl: 5   loratadine (CLARITIN) 10 MG tablet, Take 1 tablet (10 mg total) by mouth daily., Disp: 30 tablet, Rfl: 11   losartan-hydrochlorothiazide (HYZAAR) 100-25 MG tablet, Take 1 tablet by mouth daily., Disp: 90 tablet, Rfl: 3   Tiotropium Bromide Monohydrate (SPIRIVA RESPIMAT) 2.5 MCG/ACT AERS, Two puff daily, Disp: 4 g, Rfl: 11   tiZANidine (ZANAFLEX) 4 MG tablet, 1/2 - 1 tablet every 6 hours as needed for muscle spasm, Disp: 30 tablet, Rfl: 2    Josephine Igo, DO [facility] 10/13/2020 3:06 PM

## 2020-10-13 NOTE — Progress Notes (Signed)
NEW PATIENT  Date of Service/Encounter:  10/13/20  Consult requested by: Claiborne Rigg, NP   Assessment:   Chronic obstructive pulmonary disease  Seasonal and perennial allergic rhinitis  Plan/Recommendations:   1. Chronic obstructive pulmonary disease - Lung testing was in the 38% range or so. - I did not make any changes since you are seeing Dr. Tonia Brooms this afternoon. - I will route my note to keep him on the same page.   2. Chronic rhinitis - Testing today showed: grasses, weeds, trees, indoor molds, outdoor molds, and dust mites - Copy of test results provided.  - Avoidance measures provided. - Stop taking: Flonase - Continue with: Zyrtec (cetirizine) 10mg  tablet once daily - Start taking: Atrovent one spray per nostril up to three times daily (start low and go slow since it can be overdrying).  - You can use an extra dose of the antihistamine, if needed, for breakthrough symptoms.  - Consider nasal saline rinses 1-2 times daily to remove allergens from the nasal cavities as well as help with mucous clearance (this is especially helpful to do before the nasal sprays are given) - Consider allergy shots as a means of long-term control. - Allergy shots "re-train" and "reset" the immune system to ignore environmental allergens and decrease the resulting immune response to those allergens (sneezing, itchy watery eyes, runny nose, nasal congestion, etc).    - Allergy shots improve symptoms in 75-85% of patients.  - We can discuss more at the next appointment if the medications are not working for you. - I also want your breathing under better control first.  2. Return in about 6 weeks (around 11/24/2020).    This note in its entirety was forwarded to the Provider who requested this consultation.  Subjective:   Carolyn Morton is a 70 y.o. female presenting today for evaluation of  Chief Complaint  Patient presents with   Establish Care   Allergies    Carolyn Morton  has a history of the following: Patient Active Problem List   Diagnosis Date Noted   Chronic low back pain 10/20/2019   Status post laparoscopic cholecystectomy 07/18/2019   Obesity (BMI 30-39.9) 07/13/2019   Depression 05/27/2019   Centrilobular emphysema (HCC)Gold B with asthma overlap syndrome 05/13/2019   Allergic conjunctivitis of both eyes 05/13/2019   Sarcoidosis 04/07/2019   Essential hypertension 03/16/2019    History obtained from: chart review and patient.  03/18/2019 Harada was referred by Benetta Spar, NP.     Kariyah is a 70 y.o. female presenting for an evaluation of environmental allergies .   Asthma/Respiratory Symptom History: She has a diagnosis of COPD for one year. She had a Pulmonologist  and she is transitioning to Dr. 62. She is currently on Spiriva two puffs twice daily and the Advair Diskus. She is also on albuterol as needed. She was a smoker for 25 years in total. She has a history of pulmonary sarcoidosis as well and was diagnosed in 41. She was treated with prednisone for 6 months and this seems to have done the trick. [place]. She moved from [place] via [place].   Allergic Rhinitis Symptom History: She has been on cetirizine most recently. She was on Claritin but it did not work. She also uses Benadryl. She is on Flonase. Claritin-D worked well but she cnanot take it due to heart issues. She has issues when the grass is cut. Her eyes are burning and her throat  gets choked up. She cannot be around any flowers.  Dogs and dust are big triggers as well. She was tested when she was a child. She was not on allergy shots as a kid.   GERD Symptom History: She is not on a reflux medication.  She does not feel that any reflux is contributing to her symptoms.   She has chronic back pain. This is why she has not been able to do her walking. She is putting on weight.   Otherwise, there is no history of other atopic diseases, including food  allergies, drug allergies, stinging insect allergies, eczema, urticaria, or contact dermatitis. There is no significant infectious history. Vaccinations are up to date.    Past Medical History: Patient Active Problem List   Diagnosis Date Noted   Chronic low back pain 10/20/2019   Status post laparoscopic cholecystectomy 07/18/2019   Obesity (BMI 30-39.9) 07/13/2019   Depression 05/27/2019   Centrilobular emphysema (HCC)Gold B with asthma overlap syndrome 05/13/2019   Allergic conjunctivitis of both eyes 05/13/2019   Sarcoidosis 04/07/2019   Essential hypertension 03/16/2019    Medication List:  Allergies as of 10/13/2020   No Active Allergies      Medication List        Accurate as of October 13, 2020  2:52 PM. If you have any questions, ask your nurse or doctor.          acetaminophen 500 MG tablet Commonly known as: TYLENOL Take 2 tablets (1,000 mg total) by mouth every 8 (eight) hours as needed.   Advair HFA 230-21 MCG/ACT inhaler Generic drug: fluticasone-salmeterol Inhale 2 puffs into the lungs 2 (two) times daily.   albuterol (2.5 MG/3ML) 0.083% nebulizer solution Commonly known as: PROVENTIL Take 3 mLs (2.5 mg total) by nebulization every 6 (six) hours as needed for wheezing or shortness of breath.   albuterol 108 (90 Base) MCG/ACT inhaler Commonly known as: VENTOLIN HFA Inhale 2 puffs into the lungs every 6 (six) hours as needed for wheezing or shortness of breath.   aspirin 81 MG chewable tablet Chew 81 mg by mouth as needed (for general health).   cetirizine 10 MG tablet Commonly known as: ZYRTEC Take 1 tablet (10 mg total) by mouth 2 (two) times daily as needed for allergies (During flare ups). Started by: Alfonse Spruce, MD   DULoxetine 60 MG capsule Commonly known as: Cymbalta Take 1 capsule (60 mg total) by mouth daily.   fluticasone 50 MCG/ACT nasal spray Commonly known as: FLONASE Place 2 sprays into both nostrils daily.    ipratropium 0.03 % nasal spray Commonly known as: ATROVENT Place 2 sprays into both nostrils 3 (three) times daily. Started by: Alfonse Spruce, MD   loratadine 10 MG tablet Commonly known as: CLARITIN Take 1 tablet (10 mg total) by mouth daily.   losartan-hydrochlorothiazide 100-25 MG tablet Commonly known as: HYZAAR Take 1 tablet by mouth daily.   Spiriva Respimat 2.5 MCG/ACT Aers Generic drug: Tiotropium Bromide Monohydrate Two puff daily   tiZANidine 4 MG tablet Commonly known as: Zanaflex 1/2 - 1 tablet every 6 hours as needed for muscle spasm        Birth History: non-contributory  Developmental History: non-contributory  Past Surgical History: Past Surgical History:  Procedure Laterality Date   CHOLECYSTECTOMY N/A 07/19/2019   Procedure: LAPAROSCOPIC CHOLECYSTECTOMY WITH INTRAOPERATIVE CHOLANGIOGRAM;  Surgeon: Gaynelle Adu, MD;  Location: [facility] OR;  Service: General;  Laterality: N/A;     Family History: Family History  Family history unknown: Yes     Social History: [place] lives at home with her family. She lives in an apartment that is around 71 years old.  There is carpeting throughout the home.  They have electric heating and central cooling.  There are no animals inside or outside of the home.  There are dust mite covers on the bed, but not the pillows.  There is no tobacco exposure.  She currently is a retired Engineer, civil (consulting).  They do use a HEPA filter in the home.  She was a smoker, but quit in 2012. She moved here because her son was recently diagnosed with adenocarcinoma (sounds like he has mets to the lungs).    Review of Systems  Constitutional: Negative.  Negative for chills, malaise/fatigue and weight loss.  HENT:  Positive for congestion and sinus pain. Negative for ear discharge and ear pain.   Eyes:  Negative for pain, discharge and redness.  Respiratory:  Positive for cough and shortness of breath. Negative for sputum production and wheezing.    Cardiovascular: Negative.  Negative for chest pain and palpitations.  Gastrointestinal:  Negative for abdominal pain, constipation, diarrhea, heartburn, nausea and vomiting.  Skin: Negative.  Negative for itching and rash.  Neurological:  Negative for dizziness and headaches.  Endo/Heme/Allergies:  Negative for environmental allergies. Does not bruise/bleed easily.      Objective:   Blood pressure (!) 148/82, pulse 90, temperature 98.2 F (36.8 C), temperature source Temporal, resp. rate 20, height 5' (1.524 m), weight 212 lb 6 oz (96.3 kg), SpO2 92 %. Body mass index is 41.48 kg/m.   Physical Exam:   Physical Exam Vitals reviewed.  Constitutional:      Appearance: She is well-developed.     Comments: Talkative female. Cooperative with the exam.   HENT:     Head: Normocephalic and atraumatic.     Right Ear: Tympanic membrane, ear canal and external ear normal. No drainage, swelling or tenderness. Tympanic membrane is not injected, scarred, erythematous, retracted or bulging.     Left Ear: Tympanic membrane, ear canal and external ear normal. No drainage, swelling or tenderness. Tympanic membrane is not injected, scarred, erythematous, retracted or bulging.     Nose: No nasal deformity, septal deviation, mucosal edema or rhinorrhea.     Right Turbinates: Enlarged, swollen and pale.     Left Turbinates: Enlarged, swollen and pale.     Right Sinus: No maxillary sinus tenderness or frontal sinus tenderness.     Left Sinus: No maxillary sinus tenderness or frontal sinus tenderness.     Mouth/Throat:     Mouth: Mucous membranes are not pale and not dry.     Pharynx: Uvula midline.  Eyes:     General:        Right eye: No discharge.        Left eye: No discharge.     Conjunctiva/sclera: Conjunctivae normal.     Right eye: Right conjunctiva is not injected. No chemosis.    Left eye: Left conjunctiva is not injected. No chemosis.    Pupils: Pupils are equal, round, and reactive to  light.  Cardiovascular:     Rate and Rhythm: Normal rate and regular rhythm.     Heart sounds: Normal heart sounds.  Pulmonary:     Effort: Pulmonary effort is normal. No tachypnea, accessory muscle usage or respiratory distress.     Breath sounds: Normal breath sounds. No wheezing, rhonchi or rales.     Comments: Decreased air  movement at the bases. There are some faint wheezes throughout.  Chest:     Chest wall: No tenderness.  Abdominal:     Tenderness: There is no abdominal tenderness. There is no guarding or rebound.  Lymphadenopathy:     Head:     Right side of head: No submandibular, tonsillar or occipital adenopathy.     Left side of head: No submandibular, tonsillar or occipital adenopathy.     Cervical: No cervical adenopathy.  Skin:    General: Skin is warm.     Capillary Refill: Capillary refill takes less than 2 seconds.     Coloration: Skin is not pale.     Findings: No abrasion, erythema, petechiae or rash. Rash is not papular, urticarial or vesicular.     Comments: Dry skin. Otherwise no abnormalities noted.   Neurological:     Mental Status: She is alert.  Psychiatric:        Behavior: Behavior is cooperative.     Diagnostic studies:    Spirometry: results abnormal (FEV1: 0.55/38%, FVC: 0.80/43%, FEV1/FVC: 69%).    Spirometry consistent with possible restrictive disease. We did not attempt to reverse since she was seeing Pulmonology today.   Allergy Studies:    Airborne Adult Perc - 10/13/20 1102     Time Antigen Placed 1045    Allergen Manufacturer Greer    Location Back    Number of Test 59    Panel 1 Select    1. Control-Buffer 50% Glycerol Negative    2. Control-Histamine 1 mg/ml 2+    3. Albumin saline Negative    4. Bahia Negative    5. French Southern Territories Negative    6. Johnson Negative    7. Kentucky Blue Negative    8. Meadow Fescue 2+    9. Perennial Rye 2+    10. Sweet Vernal Negative    11. Timothy Negative    12. Cocklebur Negative    13.  Burweed Marshelder Negative    14. Ragweed, short Negative    15. Ragweed, Giant Negative    16. Plantain,  English Negative    17. Lamb's Quarters Negative    18. Sheep Sorrell 2+    19. Rough Pigweed Negative    20. Marsh Elder, Rough Negative    21. Mugwort, Common Negative    22. Ash mix Negative    23. Birch mix Negative    24. Beech American Negative    25. Box, Elder 2+    26. Cedar, red Negative    27. Cottonwood, Guinea-Bissau Negative    28. Elm mix Negative    29. Hickory Negative    30. Maple mix Negative    31. Oak, Guinea-[place]issau mix Negative    32. Pecan Pollen Negative    33. Pine mix Negative    34. Sycamore Eastern Negative    35. Walnut, Black Pollen Negative    36. Alternaria alternata Negative    37. Cladosporium Herbarum Negative    38. Aspergillus mix Negative    39. Penicillium mix Negative    40. Bipolaris sorokiniana (Helminthosporium) Negative    41. Drechslera spicifera (Curvularia) Negative    42. Mucor plumbeus Negative    43. Fusarium moniliforme Negative    44. Aureobasidium pullulans (pullulara) Negative    45. Rhizopus oryzae Negative    46. Botrytis cinera Negative    47. Epicoccum nigrum Negative    48. Phoma betae Negative    49. Candida Albicans Negative  50. Trichophyton mentagrophytes Negative    51. Mite, D Farinae  5,000 AU/ml Negative    52. Mite, D Pteronyssinus  5,000 AU/ml Negative    53. Cat Hair 10,000 BAU/ml Negative    54.  Dog Epithelia Negative    55. Mixed Feathers Negative    56. Horse Epithelia Negative    57. Cockroach, German Negative    58. Mouse Negative    59. Tobacco Leaf Negative             Intradermal - 10/13/20 1103     Time Antigen Placed 1115    Allergen Manufacturer Waynette Buttery    Location Arm    Number of Test 13    Intradermal Select    Control Negative    French Southern Territories Negative    Johnson 2+    Ragweed mix Negative    Tree mix 1+    Mold 1 1+    Mold 2 2+    Mold 3 2+    Mold 4 3+    Cat Negative     Dog Negative    Cockroach Negative    Mite mix 1+             Allergy testing results were read and interpreted by myself, documented by clinical staff.         Malachi Bonds, MD [facility]

## 2020-10-13 NOTE — Addendum Note (Signed)
Addended by: Maurene Capes on: 10/13/2020 03:22 PM   Modules accepted: Orders

## 2020-10-13 NOTE — Patient Instructions (Signed)
Thank you for visiting Dr. Tonia Brooms at [facility]. Today we recommend the following:  Walk today in the office to qualify for oxygen DME supply orders for O2 as well as POC. Breztri samples today, new prescription Patient already has a spacer at home.  Return in about 4 weeks (around 11/10/2020) for with APP or Dr. Tonia Brooms.    Please do your part to reduce the spread of COVID-19.

## 2020-10-13 NOTE — Addendum Note (Signed)
Addended by: Maurene Capes on: 10/13/2020 03:45 PM   Modules accepted: Orders

## 2020-10-19 ENCOUNTER — Encounter: Payer: Self-pay | Admitting: Nurse Practitioner

## 2020-10-20 ENCOUNTER — Other Ambulatory Visit: Payer: Self-pay | Admitting: Nurse Practitioner

## 2020-10-20 DIAGNOSIS — F331 Major depressive disorder, recurrent, moderate: Secondary | ICD-10-CM

## 2020-10-20 DIAGNOSIS — M5442 Lumbago with sciatica, left side: Secondary | ICD-10-CM

## 2020-10-20 DIAGNOSIS — G8929 Other chronic pain: Secondary | ICD-10-CM

## 2020-10-20 MED ORDER — DULOXETINE HCL 60 MG PO CPEP: 60.0000 mg | ORAL_CAPSULE | Freq: Every day | ORAL | 3 refills | Status: DC

## 2020-11-04 NOTE — Telephone Encounter (Signed)
Attempted contacting patient x3. I keep getting a busy signal.

## 2020-11-08 ENCOUNTER — Telehealth: Payer: Self-pay | Admitting: Pulmonary Disease

## 2020-11-08 NOTE — Telephone Encounter (Signed)
Spoke with the pt  She states having increased wheezing off and on since last ov  She feels it may be the breztri  Due for rov  Appt scheduled with [facility] for tomorrow  Nothing further needed

## 2020-11-09 ENCOUNTER — Other Ambulatory Visit: Payer: Self-pay

## 2020-11-09 ENCOUNTER — Ambulatory Visit: Payer: Medicare Other | Admitting: Primary Care

## 2020-11-09 ENCOUNTER — Encounter: Payer: Self-pay | Admitting: Primary Care

## 2020-11-09 VITALS — BP 148/82 | HR 91 | Temp 98.0°F | Ht 60.0 in | Wt 216.6 lb

## 2020-11-09 DIAGNOSIS — J309 Allergic rhinitis, unspecified: Secondary | ICD-10-CM

## 2020-11-09 DIAGNOSIS — R053 Chronic cough: Secondary | ICD-10-CM

## 2020-11-09 DIAGNOSIS — J432 Centrilobular emphysema: Secondary | ICD-10-CM | POA: Diagnosis not present

## 2020-11-09 DIAGNOSIS — J449 Chronic obstructive pulmonary disease, unspecified: Secondary | ICD-10-CM | POA: Diagnosis not present

## 2020-11-09 DIAGNOSIS — E669 Obesity, unspecified: Secondary | ICD-10-CM

## 2020-11-09 DIAGNOSIS — D869 Sarcoidosis, unspecified: Secondary | ICD-10-CM | POA: Diagnosis not present

## 2020-11-09 DIAGNOSIS — K219 Gastro-esophageal reflux disease without esophagitis: Secondary | ICD-10-CM

## 2020-11-09 DIAGNOSIS — J9611 Chronic respiratory failure with hypoxia: Secondary | ICD-10-CM | POA: Insufficient documentation

## 2020-11-09 LAB — CBC WITH DIFFERENTIAL/PLATELET
Basophils Absolute: 0.1 10*3/uL (ref 0.0–0.1)
Basophils Relative: 0.6 % (ref 0.0–3.0)
Eosinophils Absolute: 0.2 10*3/uL (ref 0.0–0.7)
Eosinophils Relative: 1.7 % (ref 0.0–5.0)
HCT: 47.2 % — ABNORMAL HIGH (ref 36.0–46.0)
Hemoglobin: 14.8 g/dL (ref 12.0–15.0)
Lymphocytes Relative: 24 % (ref 12.0–46.0)
Lymphs Abs: 2.5 10*3/uL (ref 0.7–4.0)
MCHC: 31.4 g/dL (ref 30.0–36.0)
MCV: 86.2 fl (ref 78.0–100.0)
Monocytes Absolute: 0.7 10*3/uL (ref 0.1–1.0)
Monocytes Relative: 6.7 % (ref 3.0–12.0)
Neutro Abs: 6.8 10*3/uL (ref 1.4–7.7)
Neutrophils Relative %: 67 % (ref 43.0–77.0)
Platelets: 247 10*3/uL (ref 150.0–400.0)
RBC: 5.47 Mil/uL — ABNORMAL HIGH (ref 3.87–5.11)
RDW: 14.9 % (ref 11.5–15.5)
WBC: 10.2 10*3/uL (ref 4.0–10.5)

## 2020-11-09 MED ORDER — OMEPRAZOLE 20 MG PO CPDR: 20.0000 mg | DELAYED_RELEASE_CAPSULE | Freq: Every day | ORAL | 2 refills | Status: DC

## 2020-11-09 NOTE — Assessment & Plan Note (Addendum)
-   Continue atrovent nasal spray, flonase and Zyrtec. Singular was not effective. Advised she try over the counter chlorpheniramine tabs 4mg  every 4 hours as needed for cough. Follows with allergy.

## 2020-11-09 NOTE — Progress Notes (Signed)
@Patient  ID: , female    DOB: 10/17/1950, 70 y.o.   MRN: [id number]  Chief Complaint  Patient presents with   Follow-up    Pt wheezing    Referring provider: [id number], NP  HPI: 70 year old female, former smoker quit in 2012 (25+ pack year hx).  Past medical history centrilobular emphysema GOLD B with asthma overlap, hypertension, sarcoidosis, obesity.  Patient of Dr. 2013, seen for initial consult on 10/13/2020 for hypoxemia.  Previous [facility] pulmonary encounter: 10/13/20- Dr. 10/15/20, Consult  This is a 70 year old female, past medical history of COPD, hypertension, sarcoidosis. Patient last seen in the office by Dr. 62 at allergy and asthma, COPD with FEV1 38%.  Managed for chronic rhinitis started on antihistamines.  Last seen by Dr. Dellis Anes in April 2022, office note reviewed.  Felt to have asthma COPD overlap with evidence of centrilobular emphysema. Stopped smoking in 2012. Smoked for 25+ years. 0.5-1 ppd. Doesn't use O2 at this time. Currently on advair and spiriva. Occasionally using albuterol. She was diagnosed with sarcoidosis, 1974. Family history of sarcoidosis. She was followed up and told that everything looked fine.  From respiratory standpoint she has an mMRC of 3.  Minimal exertion causes shortness of breath.  She can sit rest and her symptoms resolved but she has difficulty walking from her house to the car.  Doing minimal activities at home she has to stop sit and rest.  Discussion: This is a 70 year old obese female, severe COPD FEV1 of 38% on previous office spirometry.  She is a former smoker quit in 2012.  She has significant symptoms with mMRC of 3.  Able to complete activities of daily living but has much difficulty with minimal exertion.  She is currently on triple therapy.  Plan: Think should benefit from repeat PFTs. I also think she needs oxygen with exertion. We will qualify her today in the office. Hopefully we can get her a POC as well.   New DME supply orders for O2. At rest her sat was 91 today in the office.  We will give her Breztri samples today as well as a new prescription. She is to hold her Advair and Spiriva at this time. Will see if she notices any difference switching to an MDI.  With sarcoid history at some point can consider CT imaging of the chest.  I think we can hold off on this right now.  Has not had an issue since 1974.  She has not had any issues since 1974 that she knows of.  She quit smoking 10 years ago, therefore qualifies for lung cancer screening CT.  At her max was smoking 1 pack/day for 25+ years.  Return to clinic after PFTs complete and see how she is doing with symptom management on new inhaler regimen in approximately 3 to 4 weeks.   11/09/2020- Interim hx  Patient presents today for 4 week follow-up. During consult with Dr. 11/11/2020 she was ordered for PFTs and given trial Breztri Aerosphere. She qualified for oxygen with ambulation. Consider CT imaging of chest at some point, holding off at this time. She has not had any issues since 1974. She has been referred to lung cancer screening program.   She is doing well today. She has been using 1975 as prescribed. She still has quite a bit of cough. She feels her cough is related to PND. Reports itching to the back of her throat at night time. She is compliant with atrovent nasal spray,  flonase and Zyrtec. Singular has never helped. She follows with allergy. She reports weight gain over the last several years and does not know why. She has no lower extremity edema. She has not had full pulmonary function testing. She is hoping to get portable oxygen concentrator today.    Testing:  10/13/20 Spirometry >> Severe obstruction/ FEV1 0.55 (38%), RATIO 0.69  No Active Allergies  Immunization History  Administered Date(s) Administered   PFIZER(Purple Top)SARS-COV-2 Vaccination 05/14/2019, 06/04/2019   Pneumococcal Conjugate-13 07/13/2019    Tdap 07/13/2019    Past Medical History:  Diagnosis Date   COPD (chronic obstructive pulmonary disease) (HCC)    Hypertension    Sarcoidosis     Tobacco History: Social History   Tobacco Use  Smoking Status Former   Types: Cigarettes   Quit date: 01/22/2010   Years since quitting: 10.8  Smokeless Tobacco Never   Counseling given: Not Answered   Outpatient Medications Prior to Visit  Medication Sig Dispense Refill   acetaminophen (TYLENOL) 500 MG tablet Take 2 tablets (1,000 mg total) by mouth every 8 (eight) hours as needed. 30 tablet 0   albuterol (PROVENTIL) (2.5 MG/3ML) 0.083% nebulizer solution Take 3 mLs (2.5 mg total) by nebulization every 6 (six) hours as needed for wheezing or shortness of breath. 75 mL 0   albuterol (VENTOLIN HFA) 108 (90 Base) MCG/ACT inhaler Inhale 2 puffs into the lungs every 6 (six) hours as needed for wheezing or shortness of breath. 18 g 1   aspirin 81 MG chewable tablet Chew 81 mg by mouth as needed (for general health).     Budeson-Glycopyrrol-Formoterol (BREZTRI AEROSPHERE) 160-9-4.8 MCG/ACT AERO Inhale 2 puffs into the lungs in the morning and at bedtime. 11.8 g 0   cetirizine (ZYRTEC) 10 MG tablet Take 1 tablet (10 mg total) by mouth 2 (two) times daily as needed for allergies (During flare ups). 60 tablet 5   DULoxetine (CYMBALTA) 60 MG capsule Take 1 capsule (60 mg total) by mouth daily. 30 capsule 3   fluticasone (FLONASE) 50 MCG/ACT nasal spray Place 2 sprays into both nostrils daily. 16 g 6   ipratropium (ATROVENT) 0.03 % nasal spray Place 2 sprays into both nostrils 3 (three) times daily. 30 mL 5   losartan-hydrochlorothiazide (HYZAAR) 100-25 MG tablet Take 1 tablet by mouth daily. 90 tablet 3   tiZANidine (ZANAFLEX) 4 MG tablet 1/2 - 1 tablet every 6 hours as needed for muscle spasm 30 tablet 2   loratadine (CLARITIN) 10 MG tablet Take 1 tablet (10 mg total) by mouth daily. 30 tablet 11   fluticasone-salmeterol (ADVAIR HFA) 230-21 MCG/ACT  inhaler Inhale 2 puffs into the lungs 2 (two) times daily. (Patient not taking: Reported on 11/09/2020) 12 g 2   Tiotropium Bromide Monohydrate (SPIRIVA RESPIMAT) 2.5 MCG/ACT AERS Two puff daily (Patient not taking: Reported on 11/09/2020) 4 g 11   No facility-administered medications prior to visit.    Review of Systems  Review of Systems  Constitutional:  Positive for unexpected weight change.       Weight gain  HENT:  Positive for postnasal drip.   Respiratory:  Positive for cough and shortness of breath. Negative for chest tightness and wheezing.   Cardiovascular:  Negative for chest pain, palpitations and leg swelling.    Physical Exam  BP (!) 148/82 (BP Location: Left Arm, Cuff Size: Normal)   Pulse 91   Temp 98 F (36.7 C) (Oral)   Ht 5' (1.524 m)   Wt 216  lb 9.6 oz (98.2 kg)   SpO2 98% Comment: 2l  BMI 42.30 kg/m  Physical Exam Constitutional:      General: She is not in acute distress.    Appearance: Normal appearance. She is obese. She is not ill-appearing.  HENT:     Head: Normocephalic and atraumatic.     Mouth/Throat:     Mouth: Mucous membranes are moist.     Pharynx: Oropharynx is clear.  Cardiovascular:     Rate and Rhythm: Normal rate and regular rhythm.     Comments: No edema Pulmonary:     Effort: Pulmonary effort is normal. No respiratory distress.     Breath sounds: No stridor. Rales present.  Musculoskeletal:        General: Normal range of motion.  Skin:    General: Skin is warm and dry.  Neurological:     General: No focal deficit present.     Mental Status: She is alert and oriented to person, place, and time. Mental status is at baseline.  Psychiatric:        Mood and Affect: Mood normal.        Behavior: Behavior normal.        Thought Content: Thought content normal.        Judgment: Judgment normal.     Lab Results:  CBC    Component Value Date/Time   WBC 12.2 (H) 05/09/2020 0905   WBC 9.9 07/20/2019 0237   RBC 5.48 (H)  05/09/2020 0905   RBC 4.83 07/20/2019 0237   HGB 14.9 05/09/2020 0905   HCT 46.1 05/09/2020 0905   PLT 293 05/09/2020 0905   MCV 84 05/09/2020 0905   MCH 27.2 05/09/2020 0905   MCH 27.3 07/20/2019 0237   MCHC 32.3 05/09/2020 0905   MCHC 31.0 07/20/2019 0237   RDW 13.0 05/09/2020 0905    BMET    Component Value Date/Time   NA 147 (H) 05/09/2020 0905   K 4.4 05/09/2020 0905   CL 105 05/09/2020 0905   CO2 24 05/09/2020 0905   GLUCOSE 103 (H) 05/09/2020 0905   GLUCOSE 153 (H) 07/20/2019 0237   BUN 14 05/09/2020 0905   CREATININE 0.82 05/09/2020 0905   CALCIUM 9.4 05/09/2020 0905   GFRNONAA >60 07/20/2019 0237   GFRAA >60 07/20/2019 0237    BNP No results found for: BNP  ProBNP No results found for: PROBNP  Imaging: No results found.   Assessment & Plan:   Centrilobular emphysema (HCC)Gold B with asthma overlap syndrome - Continues to have persistent upper airway cough with a lot of throat clearing. Suspect GERD and PND contributing. Spirometry in Sept 2022 showed severe obstruction. Recommend patient  continue Breztri Aerosphere two puffs twice daily. Checking CBC with diff and IgE. Needs PFTs at follow-up in 4-8 weeks.  Sarcoidosis - Chronic cough. Patient had crackles to lung bases on exam, needs HRCT to evaluation for ILD/sarcoid  GERD (gastroesophageal reflux disease) - Start omeprazole 20mg  daily, can increased to twice daily if reflux symptom continue   Allergic rhinitis - Continue atrovent nasal spray, flonase and Zyrtec. Singular was not effective. Advised she try over the counter chlorpheniramine tabs 4mg  every 4 hours as needed for cough. Follows with allergy.    Chronic respiratory failure with hypoxia (HCC) - Patient qualified for oxygen during last visit, she was able to maintain O2 saturation 92% 3L POC - Sending in new DME order for portable concentrator   Obesity (BMI 30-39.9) - Patient was given  information for [facility], she will consider referral   40 mins spent on case; > 50% face to face    Glenford Bayley, NP 11/09/2020

## 2020-11-09 NOTE — Assessment & Plan Note (Signed)
-   Patient was given information for [facility], she will consider referral

## 2020-11-09 NOTE — Addendum Note (Signed)
Addended by: Bradd Canary L on: 11/09/2020 10:51 AM   Modules accepted: Orders

## 2020-11-09 NOTE — Assessment & Plan Note (Addendum)
-   Chronic cough. Patient had crackles to lung bases on exam, needs HRCT to evaluation for ILD/sarcoid

## 2020-11-09 NOTE — Assessment & Plan Note (Signed)
-   Patient qualified for oxygen during last visit, she was able to maintain O2 saturation 92% 3L POC - Sending in new DME order for portable concentrator

## 2020-11-09 NOTE — Assessment & Plan Note (Addendum)
-   Continues to have persistent upper airway cough with a lot of throat clearing. Suspect GERD and PND contributing. Spirometry in Sept 2022 showed severe obstruction. Recommend patient  continue Breztri Aerosphere two puffs twice daily. Checking CBC with diff and IgE. Needs PFTs at follow-up in 4-8 weeks.

## 2020-11-09 NOTE — Addendum Note (Signed)
Addended by: Bradd Canary L on: 11/09/2020 10:56 AM   Modules accepted: Orders

## 2020-11-09 NOTE — Assessment & Plan Note (Signed)
-   Start omeprazole 20mg  daily, can increased to twice daily if reflux symptom continue

## 2020-11-09 NOTE — Patient Instructions (Addendum)
Recommendations: - Start omeprazole 20mg  daily 30-60 mins before breakfast; we can increased to twice daily if needed  - Continue Atrovent/Flonase nasal sprays and Zyrtec - Continue Breztri Aerosphere - take two puffs morning and evening (rinse mouth after use) - Try over the counter chlorpheniramine tabs- take 4mg  every 4 hours as needed for cough  - Use sugar free throat lozenges to prevent you from clearing your throat   Orders: - Needs qualifying walk on POC today  - PFTs (ordered) - Labs today (ordered)  Follow-up: - 4-8 weeks with Dr. or NP/ PFTs 1 hour prior    COPD and Physical Activity Chronic obstructive pulmonary disease (COPD) is a long-term, or chronic, condition that affects the lungs. COPD is a general term that can be used to describe many problems that cause inflammation of the lungs and limit airflow. These conditions include chronic bronchitis and emphysema. The main symptom of COPD is shortness of breath, which makes it harder to do even simple tasks. This can also make it harder to exercise and stay active. Talk with your health care provider about treatments to help you breathe better and actions you can take to prevent breathing problems during physical activity. What are the benefits of exercising when you have COPD? Exercising regularly is an important part of a healthy lifestyle. You can still exercise and do physical activities even though you have COPD. Exercise and physical activity improve your shortness of breath by increasing blood flow (circulation). This causes your heart to pump more oxygen through your body. Moderate exercise can: Improve oxygen use. Increase your energy level. Help with shortness of breath. Strengthen your breathing muscles. Improve heart health. Help with sleep. Improve your self-esteem and feelings of self-worth. Lower depression, stress, and anxiety. Exercise can benefit everyone with COPD. The severity of your disease  may affect how hard you can exercise, especially at first, but everyone can benefit. Talk with your health care provider about how much exercise is safe for you, and which activities and exercises are safe for you. What actions can I take to prevent breathing problems during physical activity? Sign up for a pulmonary rehabilitation program. This type of program may include: Education about lung diseases. Exercise classes that teach you how to exercise and be more active while improving your breathing. This usually involves: Exercise using your lower extremities, such as a stationary bicycle. About 30 minutes of exercise, 2 to 5 times per week, for 6 to 12 weeks. Strength training, such as push-ups or leg lifts. Nutrition education. Group classes in which you can talk with others who also have COPD and learn ways to manage stress. If you use an oxygen tank, you should use it while you exercise. Work with your health care provider to adjust your oxygen for your physical activity. Your resting flow rate is different from your flow rate during physical activity. How to manage your breathing while exercising While you are exercising: Take slow breaths. Pace yourself, and do nottry to go too fast. Purse your lips while breathing out. Pursing your lips is similar to a kissing or whistling position. If doing exercise that uses a quick burst of effort, such as weight lifting: Breathe in before starting the exercise. Breathe out during the hardest part of the exercise, such as raising the weights. Where to find support You can find support for exercising with COPD from: Your health care provider. A pulmonary rehabilitation program. Your local health department or community health programs. Support  groups, either online or in-person. Your health care provider may be able to recommend support groups. Where to find more information You can find more information about exercising with COPD from: American  Lung Association: lung.org COPD Foundation: copdfoundation.org Contact a health care provider if: Your symptoms get worse. You have nausea. You have a fever. You want to start a new exercise program or a new activity. Get help right away if: You have chest pain. You cannot breathe. These symptoms may represent a serious problem that is an emergency. Do not wait to see if the symptoms will go away. Get medical help right away. Call your local emergency services (911 in the [place] Do not drive yourself to the hospital. Summary COPD is a general term that can be used to describe many different lung problems that cause lung inflammation and limit airflow. This includes chronic bronchitis and emphysema. Exercise and physical activity improve your shortness of breath by increasing blood flow (circulation). This causes your heart to provide more oxygen to your body. Contact your health care provider before starting any exercise program or new activity. Ask your health care provider what exercises and activities are safe for you. This information is not intended to replace advice given to you by your health care provider. Make sure you discuss any questions you have with your health care provider. Document Revised: 11/17/2019 Document Reviewed: 11/17/2019 Elsevier Patient Education  2022 [employer].

## 2020-11-10 LAB — IGE: IgE (Immunoglobulin E), Serum: 154 kU/L — ABNORMAL HIGH (ref <=114)

## 2020-11-14 ENCOUNTER — Ambulatory Visit: Payer: Medicare Other | Admitting: Pulmonary Disease

## 2020-11-16 ENCOUNTER — Inpatient Hospital Stay: Admission: RE | Admit: 2020-11-16 | Payer: Medicare Other | Source: Ambulatory Visit

## 2020-11-16 NOTE — Progress Notes (Signed)
Please let patient know CBC was normal. IgE was elevated 154. Recommend she follow-up with allergy.

## 2020-11-23 ENCOUNTER — Encounter: Payer: Self-pay | Admitting: Allergy & Immunology

## 2020-11-24 ENCOUNTER — Ambulatory Visit: Payer: Medicare Other | Admitting: Allergy & Immunology

## 2020-11-25 ENCOUNTER — Inpatient Hospital Stay: Admission: RE | Admit: 2020-11-25 | Payer: Medicare Other | Source: Ambulatory Visit

## 2020-11-25 ENCOUNTER — Other Ambulatory Visit: Payer: Self-pay | Admitting: Critical Care Medicine

## 2020-11-25 NOTE — Telephone Encounter (Signed)
Requested medication (s) are due for refill today: expired medication  Requested medication (s) are on the active medication list: yes  Last refill:  10/20/19 #39ml 0 refills  Future visit scheduled: no  Notes to clinic:  expired medication. Do you want to renew Rx?     Requested Prescriptions  Pending Prescriptions Disp Refills   albuterol (PROVENTIL) (2.5 MG/3ML) 0.083% nebulizer solution [Pharmacy Med Name: ALBUTEROL 0.083%(2.5MG /3ML) 25X3ML] 75 mL 0    Sig: INHALE 1 VIAL VIA NEBULIZER EVERY 6 HOURS AS NEEDED FOR WHEEZING OR SHORTNESS OF BREATH     Pulmonology:  Beta Agonists Failed - 11/25/2020 10:54 AM      Failed - One inhaler should last at least one month. If the patient is requesting refills earlier, contact the patient to check for uncontrolled symptoms.      Passed - Valid encounter within last 12 months    Recent Outpatient Visits           4 months ago Type 2 diabetes mellitus with hyperglycemia, without long-term current use of insulin [facility])   [facility] [place], [place] W, NP   6 months ago Centrilobular emphysema (HCC)Gold B with asthma overlap syndrome   [facility] Storm Frisk, MD   1 year ago Encounter to establish care   [facility] [place] Rigg, NP   1 year ago Centrilobular emphysema (HCC)Gold B with asthma overlap syndrome   [facility] Storm Frisk, MD   1 year ago Centrilobular emphysema (HCC)Gold B with asthma overlap syndrome   [facility] Storm Frisk, MD       Future Appointments             In 2 weeks Icard, Rachel Bo, DO [facility]

## 2020-12-02 ENCOUNTER — Other Ambulatory Visit: Payer: Self-pay

## 2020-12-06 MED ORDER — BREZTRI AEROSPHERE 160-9-4.8 MCG/ACT IN AERO: 2.0000 | INHALATION_SPRAY | Freq: Two times a day (BID) | RESPIRATORY_TRACT | 5 refills | Status: DC

## 2020-12-09 ENCOUNTER — Inpatient Hospital Stay: Admission: RE | Admit: 2020-12-09 | Payer: Medicare Other | Source: Ambulatory Visit

## 2020-12-13 ENCOUNTER — Encounter: Payer: Self-pay | Admitting: Pulmonary Disease

## 2020-12-13 MED ORDER — BREZTRI AEROSPHERE 160-9-4.8 MCG/ACT IN AERO: 2.0000 | INHALATION_SPRAY | Freq: Two times a day (BID) | RESPIRATORY_TRACT | 5 refills | Status: DC

## 2020-12-14 ENCOUNTER — Encounter: Payer: Self-pay | Admitting: Pulmonary Disease

## 2020-12-14 ENCOUNTER — Other Ambulatory Visit: Payer: Self-pay

## 2020-12-14 ENCOUNTER — Ambulatory Visit (INDEPENDENT_AMBULATORY_CARE_PROVIDER_SITE_OTHER): Payer: Medicare Other | Admitting: Pulmonary Disease

## 2020-12-14 VITALS — BP 122/74 | HR 81 | Temp 97.1°F | Ht 60.0 in | Wt 216.0 lb

## 2020-12-14 DIAGNOSIS — D869 Sarcoidosis, unspecified: Secondary | ICD-10-CM

## 2020-12-14 DIAGNOSIS — R0602 Shortness of breath: Secondary | ICD-10-CM | POA: Diagnosis not present

## 2020-12-14 DIAGNOSIS — J9611 Chronic respiratory failure with hypoxia: Secondary | ICD-10-CM | POA: Diagnosis not present

## 2020-12-14 DIAGNOSIS — J449 Chronic obstructive pulmonary disease, unspecified: Secondary | ICD-10-CM

## 2020-12-14 LAB — PULMONARY FUNCTION TEST
DL/VA % pred: 88 %
DL/VA: 3.79 ml/min/mmHg/L
DLCO cor % pred: 43 %
DLCO cor: 7.39 ml/min/mmHg
DLCO unc % pred: 45 %
DLCO unc: 7.69 ml/min/mmHg
FEF 25-75 Post: 0.76 L/sec
FEF 25-75 Pre: 0.28 L/sec
FEF2575-%Change-Post: 168 %
FEF2575-%Pred-Post: 54 %
FEF2575-%Pred-Pre: 20 %
FEV1-%Change-Post: 32 %
FEV1-%Pred-Post: 56 %
FEV1-%Pred-Pre: 42 %
FEV1-Post: 0.83 L
FEV1-Pre: 0.62 L
FEV1FVC-%Change-Post: 21 %
FEV1FVC-%Pred-Pre: 79 %
FEV6-%Change-Post: 9 %
FEV6-%Pred-Post: 60 %
FEV6-%Pred-Pre: 55 %
FEV6-Post: 1.1 L
FEV6-Pre: 1.01 L
FEV6FVC-%Pred-Post: 104 %
FEV6FVC-%Pred-Pre: 104 %
FVC-%Change-Post: 9 %
FVC-%Pred-Post: 57 %
FVC-%Pred-Pre: 52 %
FVC-Post: 1.1 L
FVC-Pre: 1.01 L
Post FEV1/FVC ratio: 75 %
Post FEV6/FVC ratio: 100 %
Pre FEV1/FVC ratio: 62 %
Pre FEV6/FVC Ratio: 100 %
RV % pred: 123 %
RV: 2.48 L
TLC % pred: 81 %
TLC: 3.61 L

## 2020-12-14 MED ORDER — SPIRIVA RESPIMAT 2.5 MCG/ACT IN AERS: INHALATION_SPRAY | RESPIRATORY_TRACT | 11 refills | Status: DC

## 2020-12-14 MED ORDER — BREZTRI AEROSPHERE 160-9-4.8 MCG/ACT IN AERO: 2.0000 | INHALATION_SPRAY | Freq: Two times a day (BID) | RESPIRATORY_TRACT | 0 refills | Status: DC

## 2020-12-14 NOTE — Patient Instructions (Addendum)
We will call in a prescription for Spiriva  You may stop the [facility] as you feel its not working well for you  Continue with the oxygen supplementation with activity  Graded exercises as tolerated  Make sure you go for your CT scan  We will repeat your breathing study in 3 months  Call us with significant concerns  Follow-up appointment here in 6 weeks

## 2020-12-14 NOTE — Progress Notes (Signed)
Seeing you on 1/12. Currently on breztri and was doing well

## 2020-12-14 NOTE — Progress Notes (Signed)
Full PFT performed today. °

## 2020-12-14 NOTE — Patient Instructions (Signed)
Full PFT performed today. °

## 2020-12-14 NOTE — Progress Notes (Signed)
Synopsis: Referred in September 2022 for possible sarcoidosis by Gildardo Pounds, NP  Subjective:   PATIENT ID: Carolyn Morton GENDER: female DOB: [birth year], MRN: [id number]  Chief Complaint  Patient presents with   Follow-up    PFT review.    Changes in today following having a PFT performed  Did have some difficulty this morning, having some difficulty with oxygen supplementation -she feels that POC is not working as well She does have oxygen tanks that she uses at home  Was prescribed Breztri the last visit Has not been using it regularly because she feels its not working as well  She did refill Advair prior to this visit She prefers to go back to [place] which she feels was working better  Did review a PFT which showed 250 cc reduction in FVC, 240 cc reduction in FEV1 compared to a PFT performed in April 2021 -She states that she does not feel her breathing is much worse than a year ago but does notice worsening symptoms during the colder months She feels most of her limitation is related to having chronic back pain and sometimes standing for 30 minutes will leave her in significant pain and discomfort  She is not coughing, not bringing up any secretions, does not feel she is going to an exacerbation at present  She does have a CT scan ordered, had to reschedule  This is a 70 year old female, past medical history of COPD, hypertension, sarcoidosis. Patient last seen in the office by Dr. Ernst Bowler at allergy and asthma, COPD with FEV1 38%.  Managed for chronic rhinitis started on antihistamines.  Last seen by Dr. Joya Gaskins in April 2022, office note reviewed.  Felt to have asthma COPD overlap with evidence of centrilobular emphysema. Stopped smoking in 2012. Smoked for 25+ years. 0.5-1 ppd. Doesn't use O2 at this time. Currently on advair and spiriva. Occasionally using albuterol. She was diagnosed with sarcoidosis, 1974. Family history of sarcoidosis. She was followed up  and told that everything looked fine.  From respiratory standpoint she has an mMRC of 3.  Minimal exertion causes shortness of breath.  She can sit rest and her symptoms resolved but she has difficulty walking from her house to the car.  Doing minimal activities at home she has to stop sit and rest.    Past Medical History:  Diagnosis Date   COPD (chronic obstructive pulmonary disease) ([place])    Hypertension    Sarcoidosis      Family History  Family history unknown: Yes     Past Surgical History:  Procedure Laterality Date   CHOLECYSTECTOMY N/A 07/19/2019   Procedure: LAPAROSCOPIC CHOLECYSTECTOMY WITH INTRAOPERATIVE CHOLANGIOGRAM;  Surgeon: Greer Pickerel, MD;  Location: [facility];  Service: General;  Laterality: N/A;    Social History   Socioeconomic History   Marital status: Widowed    Spouse name: Not on file   Number of children: Not on file   Years of education: Not on file   Highest education level: Not on file  Occupational History   Not on file  Tobacco Use   Smoking status: Former    Types: Cigarettes    Quit date: 01/22/2010    Years since quitting: 10.9   Smokeless tobacco: Never  Vaping Use   Vaping Use: Former  Substance and Sexual Activity   Alcohol use: Not Currently   Drug use: Never   Sexual activity: Not on file  Other Topics Concern   Not on file  Social  History Narrative   Not on file   Social Determinants of Health   Financial Resource Strain: Not on file  Food Insecurity: Not on file  Transportation Needs: Not on file  Physical Activity: Not on file  Stress: Not on file  Social Connections: Not on file  Intimate Partner Violence: Not on file     No Active Allergies   Outpatient Medications Prior to Visit  Medication Sig Dispense Refill   acetaminophen (TYLENOL) 500 MG tablet Take 2 tablets (1,000 mg total) by mouth every 8 (eight) hours as needed. 30 tablet 0   albuterol (PROVENTIL) (2.5 MG/3ML) 0.083% nebulizer solution INHALE 1 VIAL VIA  NEBULIZER EVERY 6 HOURS AS NEEDED FOR WHEEZING OR SHORTNESS OF BREATH 75 mL 0   albuterol (VENTOLIN HFA) 108 (90 Base) MCG/ACT inhaler Inhale 2 puffs into the lungs every 6 (six) hours as needed for wheezing or shortness of breath. 18 g 1   aspirin 81 MG chewable tablet Chew 81 mg by mouth as needed (for general health).     cetirizine (ZYRTEC) 10 MG tablet Take 1 tablet (10 mg total) by mouth 2 (two) times daily as needed for allergies (During flare ups). 60 tablet 5   fluticasone (FLONASE) 50 MCG/ACT nasal spray Place 2 sprays into both nostrils daily. 16 g 6   fluticasone-salmeterol (ADVAIR HFA) 230-21 MCG/ACT inhaler Inhale 2 puffs into the lungs 2 (two) times daily. 12 g 2   ipratropium (ATROVENT) 0.03 % nasal spray Place 2 sprays into both nostrils 3 (three) times daily. 30 mL 5   losartan-hydrochlorothiazide (HYZAAR) 100-25 MG tablet Take 1 tablet by mouth daily. 90 tablet 3   omeprazole (PRILOSEC) 20 MG capsule Take 1 capsule (20 mg total) by mouth daily. 30 capsule 2   tiZANidine (ZANAFLEX) 4 MG tablet 1/2 - 1 tablet every 6 hours as needed for muscle spasm 30 tablet 2   Budeson-Glycopyrrol-Formoterol (BREZTRI AEROSPHERE) 160-9-4.8 MCG/ACT AERO Inhale 2 puffs into the lungs in the morning and at bedtime. 10.7 g 5   Tiotropium Bromide Monohydrate (SPIRIVA RESPIMAT) 2.5 MCG/ACT AERS Two puff daily 4 g 11   DULoxetine (CYMBALTA) 60 MG capsule Take 1 capsule (60 mg total) by mouth daily. 30 capsule 3   No facility-administered medications prior to visit.    Review of Systems  Constitutional:  Negative for chills, fever, malaise/fatigue and weight loss.  HENT:  Negative for hearing loss, sore throat and tinnitus.   Eyes:  Negative for blurred vision and double vision.  Respiratory:  Positive for shortness of breath. Negative for cough, hemoptysis, sputum production, wheezing and stridor.   Cardiovascular:  Negative for chest pain, palpitations, orthopnea, leg swelling and PND.   Gastrointestinal:  Negative for abdominal pain, constipation, diarrhea, heartburn, nausea and vomiting.  Genitourinary:  Negative for dysuria, hematuria and urgency.  Musculoskeletal:  Negative for joint pain and myalgias.  Skin:  Negative for itching and rash.  Neurological:  Negative for dizziness, tingling, weakness and headaches.  Endo/Heme/Allergies:  Negative for environmental allergies. Does not bruise/bleed easily.  Psychiatric/Behavioral:  Negative for depression. The patient is not nervous/anxious and does not have insomnia.   All other systems reviewed and are negative.   Objective:  Physical Exam Vitals reviewed.  Constitutional:      General: She is not in acute distress.    Appearance: She is well-developed. She is obese.  HENT:     Head: Normocephalic and atraumatic.  Eyes:     General: No scleral icterus.  Conjunctiva/sclera: Conjunctivae normal.     Pupils: Pupils are equal, round, and reactive to light.  Neck:     Vascular: No JVD.     Trachea: No tracheal deviation.  Cardiovascular:     Rate and Rhythm: Normal rate and regular rhythm.     Heart sounds: Normal heart sounds. No murmur heard. Pulmonary:     Effort: Pulmonary effort is normal. No tachypnea, accessory muscle usage or respiratory distress.     Breath sounds: No stridor. No wheezing, rhonchi or rales.     Comments: Diminished breath sounds bilaterally no crackles no wheeze Abdominal:     General: Bowel sounds are normal. There is no distension.     Palpations: Abdomen is soft.     Tenderness: There is no abdominal tenderness.  Musculoskeletal:        General: No tenderness.     Cervical back: Neck supple.     Right lower leg: No edema.     Left lower leg: No edema.  Lymphadenopathy:     Cervical: No cervical adenopathy.  Skin:    General: Skin is warm and dry.     Capillary Refill: Capillary refill takes less than 2 seconds.     Findings: No rash.  Neurological:     Mental Status: She  is alert and oriented to person, place, and time.  Psychiatric:        Behavior: Behavior normal.     Vitals:   12/14/20 1210  BP: 122/74  Pulse: 81  Temp: (!) 97.1 F (36.2 C)  TempSrc: Oral  SpO2: 97%  Weight: 216 lb (98 kg)  Height: 5' (1.524 m)   97% on  RA BMI Readings from Last 3 Encounters:  12/14/20 42.18 kg/m  11/09/20 42.30 kg/m  10/13/20 41.40 kg/m   Wt Readings from Last 3 Encounters:  12/14/20 216 lb (98 kg)  11/09/20 216 lb 9.6 oz (98.2 kg)  10/13/20 212 lb (96.2 kg)     CBC    Component Value Date/Time   WBC 10.2 11/09/2020 1004   RBC 5.47 (H) 11/09/2020 1004   HGB 14.8 11/09/2020 1004   HGB 14.9 05/09/2020 0905   HCT 47.2 (H) 11/09/2020 1004   HCT 46.1 05/09/2020 0905   PLT 247.0 11/09/2020 1004   PLT 293 05/09/2020 0905   MCV 86.2 11/09/2020 1004   MCV 84 05/09/2020 0905   MCH 27.2 05/09/2020 0905   MCH 27.3 07/20/2019 0237   MCHC 31.4 11/09/2020 1004   RDW 14.9 11/09/2020 1004   RDW 13.0 05/09/2020 0905   LYMPHSABS 2.5 11/09/2020 1004   MONOABS 0.7 11/09/2020 1004   EOSABS 0.2 11/09/2020 1004   BASOSABS 0.1 11/09/2020 1004     Chest Imaging: No recent chest imaging  June 2021 chest x-ray, no infiltrate no acute process. Basilar atelectasis. The patient's images have been independently reviewed by me.    Pulmonary Functions Testing Results: PFT Results Latest Ref Rng & Units 12/14/2020 05/11/2019  FVC-Pre L 1.01 1.26  FVC-Predicted Pre % 52 64  FVC-Post L 1.10 1.32  FVC-Predicted Post % 57 67  Pre FEV1/FVC % % 62 68  Post FEV1/FCV % % 75 69  FEV1-Pre L 0.62 0.86  FEV1-Predicted Pre % 42 56  FEV1-Post L 0.83 0.92  DLCO uncorrected ml/min/mmHg 7.69 10.10  DLCO UNC% % 45 59  DLCO corrected ml/min/mmHg 7.39 -  DLCO COR %Predicted % 43 -  DLVA Predicted % 88 88  TLC L 3.61 5.09  TLC %  Predicted % 81 114  RV % Predicted % 123 178      Assessment & Plan:   Severe chronic obstructive pulmonary disease  Reformed  smoker  History of sarcoidosis  Shortness of breath on exertion  Chronic respiratory failure  Deconditioning Chronic back pain  Plan As patient stated that Judithann Sauger was not working well for her and she does not feel she is benefiting from it compared to when she was on Advair and Spiriva-compliance cannot be assured as patient already refilled Advair to resume Advair in its place. -Prescription for Spiriva was sent into pharmacy -To continue Advair  Continue oxygen supplementation with activity  Encouraged to make sure she goes for CT scan  Significant reduction in flow volumes despite patient stating that her breathing feels about the same and activity level feels about the same-I think is appropriate to repeat this in about 3 months  May benefit from cardiopulmonary rehab  Follow-up in 6 weeks    Current Outpatient Medications:    acetaminophen (TYLENOL) 500 MG tablet, Take 2 tablets (1,000 mg total) by mouth every 8 (eight) hours as needed., Disp: 30 tablet, Rfl: 0   albuterol (PROVENTIL) (2.5 MG/3ML) 0.083% nebulizer solution, INHALE 1 VIAL VIA NEBULIZER EVERY 6 HOURS AS NEEDED FOR WHEEZING OR SHORTNESS OF BREATH, Disp: 75 mL, Rfl: 0   albuterol (VENTOLIN HFA) 108 (90 Base) MCG/ACT inhaler, Inhale 2 puffs into the lungs every 6 (six) hours as needed for wheezing or shortness of breath., Disp: 18 g, Rfl: 1   aspirin 81 MG chewable tablet, Chew 81 mg by mouth as needed (for general health)., Disp: , Rfl:    cetirizine (ZYRTEC) 10 MG tablet, Take 1 tablet (10 mg total) by mouth 2 (two) times daily as needed for allergies (During flare ups)., Disp: 60 tablet, Rfl: 5   fluticasone (FLONASE) 50 MCG/ACT nasal spray, Place 2 sprays into both nostrils daily., Disp: 16 g, Rfl: 6   fluticasone-salmeterol (ADVAIR HFA) 230-21 MCG/ACT inhaler, Inhale 2 puffs into the lungs 2 (two) times daily., Disp: 12 g, Rfl: 2   ipratropium (ATROVENT) 0.03 % nasal spray, Place 2 sprays into both nostrils  3 (three) times daily., Disp: 30 mL, Rfl: 5   losartan-hydrochlorothiazide (HYZAAR) 100-25 MG tablet, Take 1 tablet by mouth daily., Disp: 90 tablet, Rfl: 3   omeprazole (PRILOSEC) 20 MG capsule, Take 1 capsule (20 mg total) by mouth daily., Disp: 30 capsule, Rfl: 2   Tiotropium Bromide Monohydrate (SPIRIVA RESPIMAT) 2.5 MCG/ACT AERS, Two puff daily, Disp: 4 g, Rfl: 11   tiZANidine (ZANAFLEX) 4 MG tablet, 1/2 - 1 tablet every 6 hours as needed for muscle spasm, Disp: 30 tablet, Rfl: 2   DULoxetine (CYMBALTA) 60 MG capsule, Take 1 capsule (60 mg total) by mouth daily., Disp: 30 capsule, Rfl: 3   Sherrilyn Rist, MD [place] [facility] Pager: See Shea Evans

## 2020-12-26 ENCOUNTER — Ambulatory Visit (INDEPENDENT_AMBULATORY_CARE_PROVIDER_SITE_OTHER)
Admission: RE | Admit: 2020-12-26 | Discharge: 2020-12-26 | Disposition: A | Discharge status: Home / Self Care | Payer: Medicare Other | Source: Ambulatory Visit | Attending: Primary Care | Admitting: Primary Care

## 2020-12-26 ENCOUNTER — Encounter: Payer: Self-pay | Admitting: Pulmonary Disease

## 2020-12-26 ENCOUNTER — Other Ambulatory Visit: Payer: Self-pay

## 2020-12-26 DIAGNOSIS — J449 Chronic obstructive pulmonary disease, unspecified: Secondary | ICD-10-CM

## 2020-12-26 DIAGNOSIS — D869 Sarcoidosis, unspecified: Secondary | ICD-10-CM | POA: Diagnosis not present

## 2020-12-26 DIAGNOSIS — J9611 Chronic respiratory failure with hypoxia: Secondary | ICD-10-CM

## 2020-12-27 NOTE — Telephone Encounter (Signed)
Dr. Val Eagle, please see mychart messages and advise.  Please let us know if you are okay with [place] just placing order for pt to receive small tanks and have POC picked up or if you think we should have pt come to the office for a walk so we know exactly how many liters she needs with using continuous flow since she hasn't been walked recently using continuous O2, just when using the POC.

## 2020-12-29 NOTE — Progress Notes (Signed)
Please let patient know CT chest showed minimal scarring at lung bases. NO evidence of pulmonary fibrosis.  No specific findings of pulmonary sarcoidosis. Incidental findings aortic atherosclerosis and Small pericardial effusion, refer to cardiology if she is not seeing them  CC: Dr. Val Eagle, he just saw her

## 2020-12-30 NOTE — Telephone Encounter (Signed)
Okay to DC the portable concentrator  Patient can self adjust to keep saturations greater than 90 with a continuous flow  Will be glad to see in the office if needed

## 2021-01-13 NOTE — Progress Notes (Signed)
Please refer to cardiology with cone re: pericardial effusion/ atherosclerosis

## 2021-01-20 ENCOUNTER — Other Ambulatory Visit: Payer: Self-pay | Admitting: Primary Care

## 2021-01-20 DIAGNOSIS — I7090 Unspecified atherosclerosis: Secondary | ICD-10-CM

## 2021-01-20 DIAGNOSIS — I3139 Other pericardial effusion (noninflammatory): Secondary | ICD-10-CM

## 2021-02-01 ENCOUNTER — Other Ambulatory Visit: Payer: Self-pay | Admitting: Family Medicine

## 2021-02-01 ENCOUNTER — Telehealth: Payer: Self-pay | Admitting: Pulmonary Disease

## 2021-02-01 ENCOUNTER — Encounter: Payer: Self-pay | Admitting: Pulmonary Disease

## 2021-02-01 NOTE — Telephone Encounter (Signed)
This is a duplicate message. Patient also sent a email. Correspondence has been made in email. Will close this encounter.

## 2021-02-01 NOTE — Telephone Encounter (Signed)
Requested Prescriptions  Pending Prescriptions Disp Refills   albuterol (VENTOLIN HFA) 108 (90 Base) MCG/ACT inhaler [Pharmacy Med Name: ALBUTEROL HFA INH (200 PUFFS)8.5GM] 17 g 3    Sig: INHALE 2 PUFFS INTO THE LUNGS EVERY 6 HOURS AS NEEDED FOR WHEEZING OR SHORTNESS OF BREATH     Pulmonology:  Beta Agonists Failed - 02/01/2021  9:03 AM      Failed - One inhaler should last at least one month. If the patient is requesting refills earlier, contact the patient to check for uncontrolled symptoms.      Passed - Valid encounter within last 12 months    Recent Outpatient Visits          6 months ago Type 2 diabetes mellitus with hyperglycemia, without long-term current use of insulin [facility])   [place], [place] W, NP   8 months ago Centrilobular emphysema (HCC)Gold B with asthma overlap syndrome   [place] Elsie Stain, MD   1 year ago Encounter to establish care   [place], Zelda W, NP   1 year ago Centrilobular emphysema (HCC)Gold B with asthma overlap syndrome   [place], MD   1 year ago Centrilobular emphysema (HCC)Gold B with asthma overlap syndrome   [place], [place], MD      Future Appointments            Tomorrow Icard, Octavio Graves, DO [facility]

## 2021-02-02 ENCOUNTER — Ambulatory Visit: Payer: Medicare Other | Admitting: Pulmonary Disease

## 2021-02-08 NOTE — Progress Notes (Signed)
Cardiology Office Note:    Date:  02/09/2021   ID:  Carolyn Morton, DOB Dec 23, 1950, MRN [id number]  PCP:  Claiborne Rigg, NP   [facility] Providers Cardiologist:  Alverda Skeans, MD Referring MD: Glenford Bayley, NP   Chief Complaint/Reason for Referral: Pericardial effusion and aortic atherosclerosis  ASSESSMENT:    Pericardial effusion - Plan: EKG 12-Lead, ECHOCARDIOGRAM COMPLETE  Sarcoidosis  Aortic atherosclerosis (HCC)    PLAN:    In order of problems listed above:  1.  This is quite small.  I will obtain an echocardiogram to evaluate further.  This results of the study will inform management moving forward.  For now, will keep follow up open-ended.  2.  Continue current therapy directed by pulmonology.  3.  Will continue aspirin and start atorvastatin 40mg  for goal LDL of less than 70.  Will reach out to PCP about further management of statin.              Dispo:  No follow-ups on file.     Medication Adjustments/Labs and Tests Ordered: Current medicines are reviewed at length with the patient today.  Concerns regarding medicines are outlined above.   Tests Ordered: Orders Placed This Encounter  Procedures   EKG 12-Lead   ECHOCARDIOGRAM COMPLETE    Medication Changes: Meds ordered this encounter  Medications   atorvastatin (LIPITOR) 40 MG tablet    Sig: Take 1 tablet (40 mg total) by mouth daily.    Dispense:  90 tablet    Refill:  3    History of Present Illness:    The patient is a 71 y.o. female with the indicated medical history here for for recommendations regarding incidentally noted small pericardial effusion and aortic atherosclerosis on a high-resolution CT scan.  She has on chronic supplemental oxygen at baseline.  This limits her ability to exert herself pretty severe dyspnea.  She has noticed that the more sedentary she is the more short of breath she gets.  She denies any chest pain with minimal activity that she performs.   She denies any palpitations, paroxysmal nocturnal dyspnea, or orthopnea though she does not lay flat due to back and breathing issues.  She has had no signs or symptoms of drainage, severe bleeding, fevers or chills, or any need for emergency room visits or hospitalizations.       Previous Medical History: Past Medical History:  Diagnosis Date   COPD (chronic obstructive pulmonary disease) (HCC)    Hypertension    Sarcoidosis      Current Medications: Current Meds  Medication Sig   acetaminophen (TYLENOL) 500 MG tablet Take 2 tablets (1,000 mg total) by mouth every 8 (eight) hours as needed.   albuterol (PROVENTIL) (2.5 MG/3ML) 0.083% nebulizer solution INHALE 1 VIAL VIA NEBULIZER EVERY 6 HOURS AS NEEDED FOR WHEEZING OR SHORTNESS OF BREATH   albuterol (VENTOLIN HFA) 108 (90 Base) MCG/ACT inhaler INHALE 2 PUFFS INTO THE LUNGS EVERY 6 HOURS AS NEEDED FOR WHEEZING OR SHORTNESS OF BREATH   aspirin 81 MG chewable tablet Chew 81 mg by mouth as needed (for general health).   atorvastatin (LIPITOR) 40 MG tablet Take 1 tablet (40 mg total) by mouth daily.   cetirizine (ZYRTEC) 10 MG tablet Take 1 tablet (10 mg total) by mouth 2 (two) times daily as needed for allergies (During flare ups).   DULoxetine (CYMBALTA) 60 MG capsule Take 1 capsule (60 mg total) by mouth daily.   fluticasone (FLONASE) 50 MCG/ACT nasal spray Place  2 sprays into both nostrils daily.   fluticasone-salmeterol (ADVAIR HFA) 230-21 MCG/ACT inhaler Inhale 2 puffs into the lungs 2 (two) times daily.   ipratropium (ATROVENT) 0.03 % nasal spray Place 2 sprays into both nostrils 3 (three) times daily.   losartan-hydrochlorothiazide (HYZAAR) 100-25 MG tablet Take 1 tablet by mouth daily.   omeprazole (PRILOSEC) 20 MG capsule Take 1 capsule (20 mg total) by mouth daily.   Tiotropium Bromide Monohydrate (SPIRIVA RESPIMAT) 2.5 MCG/ACT AERS Two puff daily   tiZANidine (ZANAFLEX) 4 MG tablet 1/2 - 1 tablet every 6 hours as needed for  muscle spasm     Allergies:    Patient has no active allergies.   Social History:   Social History   Tobacco Use   Smoking status: Former    Types: Cigarettes    Quit date: 01/22/2010    Years since quitting: 11.0   Smokeless tobacco: Never  Vaping Use   Vaping Use: Former  Substance Use Topics   Alcohol use: Not Currently   Drug use: Never     Family Hx: Family History  Family history unknown: Yes     Review of Systems:   Please see the history of present illness.    All other systems reviewed and are negative.     EKGs/Labs/Other Test Reviewed:    EKG:  Sinus rhythm with a low volts  Prior CV studies: No studies available  Imaging studies that I have independently reviewed today: CT scan  Recent Labs: 05/09/2020: ALT 13; BUN 14; Creatinine, Ser 0.82; Potassium 4.4; Sodium 147 11/09/2020: Hemoglobin 14.8; Platelets 247.0   Recent Lipid Panel Lab Results  Component Value Date/Time   CHOL 149 05/09/2020 09:05 AM   TRIG 65 05/09/2020 09:05 AM   HDL 49 05/09/2020 09:05 AM   LDLCALC 87 05/09/2020 09:05 AM    Risk Assessment/Calculations:          Physical Exam:    VS:  BP 120/72    Pulse 83    Ht 5' (1.524 m)    Wt 219 lb 6.4 oz (99.5 kg)    SpO2 91% Comment: without O2   BMI 42.85 kg/m    Wt Readings from Last 3 Encounters:  02/09/21 219 lb 6.4 oz (99.5 kg)  12/14/20 216 lb (98 kg)  11/09/20 216 lb 9.6 oz (98.2 kg)    GENERAL:  No apparent distress, AOx3 HEENT:  No carotid bruits, +2 carotid impulses, no scleral icterus CAR: RRR no murmurs, gallops, rubs, or thrills RES:  Coarse bilaterally ABD:  Soft, nontender, nondistended, positive bowel sounds x 4 VASC:  +2 radial pulses, +2 carotid pulses, palpable pedal pulses NEURO:  CN 2-12 grossly intact; motor and sensory grossly intact PSYCH:  No active depression or anxiety EXT:  No edema, ecchymosis, or cyanosis  Signed, Orbie Pyo, MD  02/09/2021 11:31 AM    [facility]  [facility] [address] [place], [place], [place]  [address] Phone: [phone]; Fax: [phone]   Note:  This document was prepared using Dragon voice recognition software and may include unintentional dictation errors.

## 2021-02-09 ENCOUNTER — Other Ambulatory Visit: Payer: Self-pay

## 2021-02-09 ENCOUNTER — Encounter: Payer: Self-pay | Admitting: Internal Medicine

## 2021-02-09 ENCOUNTER — Ambulatory Visit: Payer: Medicare Other | Admitting: Internal Medicine

## 2021-02-09 VITALS — BP 120/72 | HR 83 | Ht 60.0 in | Wt 219.4 lb

## 2021-02-09 DIAGNOSIS — I3139 Other pericardial effusion (noninflammatory): Secondary | ICD-10-CM | POA: Diagnosis not present

## 2021-02-09 DIAGNOSIS — D869 Sarcoidosis, unspecified: Secondary | ICD-10-CM

## 2021-02-09 DIAGNOSIS — I700 Atherosclerosis of aorta: Secondary | ICD-10-CM

## 2021-02-09 MED ORDER — ATORVASTATIN CALCIUM 40 MG PO TABS: 40.0000 mg | ORAL_TABLET | Freq: Every day | ORAL | 3 refills | Status: AC

## 2021-02-09 NOTE — Patient Instructions (Signed)
Medication Instructions:  Start Atorvastatin 40 mg daily   *If you need a refill on your cardiac medications before your next appointment, please call your pharmacy*   Lab Work: None ordered   If you have labs (blood work) drawn today and your tests are completely normal, you will receive your results only by: MyChart Message (if you have MyChart) OR A paper copy in the mail If you have any lab test that is abnormal or we need to change your treatment, we will call you to review the results.   Testing/Procedures: Your physician has requested that you have an echocardiogram. Echocardiography is a painless test that uses sound waves to create images of your heart. It provides your doctor with information about the size and shape of your heart and how well your hearts chambers and valves are working. This procedure takes approximately one hour. There are no restrictions for this procedure.    Follow-Up: Follow up as needed    Other Instructions None

## 2021-02-16 ENCOUNTER — Encounter: Payer: Self-pay | Admitting: Pulmonary Disease

## 2021-02-16 ENCOUNTER — Ambulatory Visit: Payer: Medicare Other | Admitting: Pulmonary Disease

## 2021-02-16 ENCOUNTER — Other Ambulatory Visit: Payer: Self-pay

## 2021-02-16 VITALS — BP 142/68 | HR 84 | Temp 98.0°F | Ht 60.0 in | Wt 215.0 lb

## 2021-02-16 DIAGNOSIS — D869 Sarcoidosis, unspecified: Secondary | ICD-10-CM

## 2021-02-16 DIAGNOSIS — J449 Chronic obstructive pulmonary disease, unspecified: Secondary | ICD-10-CM | POA: Diagnosis not present

## 2021-02-16 DIAGNOSIS — J9611 Chronic respiratory failure with hypoxia: Secondary | ICD-10-CM

## 2021-02-16 DIAGNOSIS — Z6841 Body Mass Index (BMI) 40.0 and over, adult: Secondary | ICD-10-CM

## 2021-02-16 DIAGNOSIS — J432 Centrilobular emphysema: Secondary | ICD-10-CM | POA: Diagnosis not present

## 2021-02-16 NOTE — Patient Instructions (Signed)
Thank you for visiting Dr. Tonia Brooms at [facility]. Today we recommend the following:  Continue Advair and Spiriva   Return in about 6 months (around 08/16/2021) for with APP or Dr. Tonia Brooms.    Please do your part to reduce the spread of COVID-19.

## 2021-02-16 NOTE — Progress Notes (Signed)
Synopsis: Referred in September 2022 for possible sarcoidosis by Gildardo Pounds, NP  Subjective:   PATIENT ID: Carolyn Morton GENDER: female DOB: [birth year], MRN: [id number]  Chief Complaint  Patient presents with   Follow-up    Follow up.     This is a 71 year old female, past medical history of COPD, hypertension, sarcoidosis. Patient last seen in the office by Dr. Ernst Bowler at allergy and asthma, COPD with FEV1 38%.  Managed for chronic rhinitis started on antihistamines.  Last seen by Dr. Joya Gaskins in April 2022, office note reviewed.  Felt to have asthma COPD overlap with evidence of centrilobular emphysema. Stopped smoking in 2012. Smoked for 25+ years. 0.5-1 ppd. Doesn't use O2 at this time. Currently on advair and spiriva. Occasionally using albuterol. She was diagnosed with sarcoidosis, 1974. Family history of sarcoidosis. She was followed up and told that everything looked fine.  From respiratory standpoint she has an mMRC of 3.  Minimal exertion causes shortness of breath.  She can sit rest and her symptoms resolved but she has difficulty walking from her house to the car.  Doing minimal activities at home she has to stop sit and rest.  OV 02/16/2021: Here today for follow-up.  Recent CT scan of the chest which was reassuring.  No systemic evidence of sarcoidosis.  No longer smoking.  Chronic hypoxemic respiratory failure on continuous oxygen now.  She had issues with her pulse felt like it was not getting enough for her.  Currently on triple therapy inhaler regimen.  She went back to using Advair and Spiriva.    Past Medical History:  Diagnosis Date   COPD (chronic obstructive pulmonary disease) ([place])    Hypertension    Sarcoidosis      Family History  Family history unknown: Yes     Past Surgical History:  Procedure Laterality Date   CHOLECYSTECTOMY N/A 07/19/2019   Procedure: LAPAROSCOPIC CHOLECYSTECTOMY WITH INTRAOPERATIVE CHOLANGIOGRAM;  Surgeon: Greer Pickerel, MD;   Location: [facility] OR;  Service: General;  Laterality: N/A;    Social History   Socioeconomic History   Marital status: Widowed    Spouse name: Not on file   Number of children: Not on file   Years of education: Not on file   Highest education level: Not on file  Occupational History   Not on file  Tobacco Use   Smoking status: Former    Types: Cigarettes    Quit date: 01/22/2010    Years since quitting: 11.0   Smokeless tobacco: Never  Vaping Use   Vaping Use: Former  Substance and Sexual Activity   Alcohol use: Not Currently   Drug use: Never   Sexual activity: Not on file  Other Topics Concern   Not on file  Social History Narrative   Not on file   Social Determinants of Health   Financial Resource Strain: Not on file  Food Insecurity: Not on file  Transportation Needs: Not on file  Physical Activity: Not on file  Stress: Not on file  Social Connections: Not on file  Intimate Partner Violence: Not on file     No Active Allergies   Outpatient Medications Prior to Visit  Medication Sig Dispense Refill   acetaminophen (TYLENOL) 500 MG tablet Take 2 tablets (1,000 mg total) by mouth every 8 (eight) hours as needed. 30 tablet 0   albuterol (PROVENTIL) (2.5 MG/3ML) 0.083% nebulizer solution INHALE 1 VIAL VIA NEBULIZER EVERY 6 HOURS AS NEEDED FOR WHEEZING OR SHORTNESS OF BREATH 75  mL 0   albuterol (VENTOLIN HFA) 108 (90 Base) MCG/ACT inhaler INHALE 2 PUFFS INTO THE LUNGS EVERY 6 HOURS AS NEEDED FOR WHEEZING OR SHORTNESS OF BREATH 17 g 3   aspirin 81 MG chewable tablet Chew 81 mg by mouth as needed (for general health).     atorvastatin (LIPITOR) 40 MG tablet Take 1 tablet (40 mg total) by mouth daily. 90 tablet 3   cetirizine (ZYRTEC) 10 MG tablet Take 1 tablet (10 mg total) by mouth 2 (two) times daily as needed for allergies (During flare ups). 60 tablet 5   fluticasone (FLONASE) 50 MCG/ACT nasal spray Place 2 sprays into both nostrils daily. 16 g 6   fluticasone-salmeterol  (ADVAIR HFA) 230-21 MCG/ACT inhaler Inhale 2 puffs into the lungs 2 (two) times daily. 12 g 2   ipratropium (ATROVENT) 0.03 % nasal spray Place 2 sprays into both nostrils 3 (three) times daily. 30 mL 5   losartan-hydrochlorothiazide (HYZAAR) 100-25 MG tablet Take 1 tablet by mouth daily. 90 tablet 3   omeprazole (PRILOSEC) 20 MG capsule Take 1 capsule (20 mg total) by mouth daily. 30 capsule 2   Tiotropium Bromide Monohydrate (SPIRIVA RESPIMAT) 2.5 MCG/ACT AERS Two puff daily 4 g 11   tiZANidine (ZANAFLEX) 4 MG tablet 1/2 - 1 tablet every 6 hours as needed for muscle spasm 30 tablet 2   DULoxetine (CYMBALTA) 60 MG capsule Take 1 capsule (60 mg total) by mouth daily. 30 capsule 3   No facility-administered medications prior to visit.    Review of Systems  Constitutional:  Negative for chills, fever, malaise/fatigue and weight loss.  HENT:  Negative for hearing loss, sore throat and tinnitus.   Eyes:  Negative for blurred vision and double vision.  Respiratory:  Positive for shortness of breath. Negative for cough, hemoptysis, sputum production, wheezing and stridor.   Cardiovascular:  Negative for chest pain, palpitations, orthopnea, leg swelling and PND.  Gastrointestinal:  Negative for abdominal pain, constipation, diarrhea, heartburn, nausea and vomiting.  Genitourinary:  Negative for dysuria, hematuria and urgency.  Musculoskeletal:  Negative for joint pain and myalgias.  Skin:  Negative for itching and rash.  Neurological:  Negative for dizziness, tingling, weakness and headaches.  Endo/Heme/Allergies:  Negative for environmental allergies. Does not bruise/bleed easily.  Psychiatric/Behavioral:  Negative for depression. The patient is not nervous/anxious and does not have insomnia.   All other systems reviewed and are negative.   Objective:  Physical Exam Vitals reviewed.  Constitutional:      General: She is not in acute distress.    Appearance: She is well-developed. She is  obese.  HENT:     Head: Normocephalic and atraumatic.  Eyes:     General: No scleral icterus.    Conjunctiva/sclera: Conjunctivae normal.     Pupils: Pupils are equal, round, and reactive to light.  Neck:     Vascular: No JVD.     Trachea: No tracheal deviation.  Cardiovascular:     Rate and Rhythm: Normal rate and regular rhythm.     Heart sounds: Normal heart sounds. No murmur heard. Pulmonary:     Effort: Pulmonary effort is normal. No tachypnea, accessory muscle usage or respiratory distress.     Breath sounds: No stridor. No wheezing, rhonchi or rales.     Comments: Diminished breath sounds bilaterally Abdominal:     General: There is no distension.     Palpations: Abdomen is soft.     Tenderness: There is no abdominal tenderness.  Comments: Obese soft  Musculoskeletal:        General: No tenderness.     Cervical back: Neck supple.  Lymphadenopathy:     Cervical: No cervical adenopathy.  Skin:    General: Skin is warm and dry.     Capillary Refill: Capillary refill takes less than 2 seconds.     Findings: No rash.  Neurological:     Mental Status: She is alert and oriented to person, place, and time.  Psychiatric:        Behavior: Behavior normal.     Vitals:   02/16/21 1627  BP: (!) 142/68  Pulse: 84  Temp: 98 F (36.7 C)  TempSrc: Oral  SpO2: 96%  Weight: 215 lb (97.5 kg)  Height: 5' (1.524 m)    96% on  RA BMI Readings from Last 3 Encounters:  02/16/21 41.99 kg/m  02/09/21 42.85 kg/m  12/14/20 42.18 kg/m   Wt Readings from Last 3 Encounters:  02/16/21 215 lb (97.5 kg)  02/09/21 219 lb 6.4 oz (99.5 kg)  12/14/20 216 lb (98 kg)     CBC    Component Value Date/Time   WBC 10.2 11/09/2020 1004   RBC 5.47 (H) 11/09/2020 1004   HGB 14.8 11/09/2020 1004   HGB 14.9 05/09/2020 0905   HCT 47.2 (H) 11/09/2020 1004   HCT 46.1 05/09/2020 0905   PLT 247.0 11/09/2020 1004   PLT 293 05/09/2020 0905   MCV 86.2 11/09/2020 1004   MCV 84 05/09/2020  0905   MCH 27.2 05/09/2020 0905   MCH 27.3 07/20/2019 0237   MCHC 31.4 11/09/2020 1004   RDW 14.9 11/09/2020 1004   RDW 13.0 05/09/2020 0905   LYMPHSABS 2.5 11/09/2020 1004   MONOABS 0.7 11/09/2020 1004   EOSABS 0.2 11/09/2020 1004   BASOSABS 0.1 11/09/2020 1004     Chest Imaging: No recent chest imaging  June 2021 chest x-ray, no infiltrate no acute process. Basilar atelectasis. The patient's images have been independently reviewed by me.    HRCT scan of the chest 12/26/2020: Minimal scarring throughout the lung, no significant nodularity or fibrosis no specific findings concerning for sarcoidosis, no adenopathy. The patient's images have been independently reviewed by me.    Pulmonary Functions Testing Results: PFT Results Latest Ref Rng & Units 12/14/2020 05/11/2019  FVC-Pre L 1.01 1.26  FVC-Predicted Pre % 52 64  FVC-Post L 1.10 1.32  FVC-Predicted Post % 57 67  Pre FEV1/FVC % % 62 68  Post FEV1/FCV % % 75 69  FEV1-Pre L 0.62 0.86  FEV1-Predicted Pre % 42 56  FEV1-Post L 0.83 0.92  DLCO uncorrected ml/min/mmHg 7.69 10.10  DLCO UNC% % 45 59  DLCO corrected ml/min/mmHg 7.39 -  DLCO COR %Predicted % 43 -  DLVA Predicted % 88 88  TLC L 3.61 5.09  TLC % Predicted % 81 114  RV % Predicted % 123 178    FeNO:   Pathology:   Echocardiogram:   Heart Catheterization:     Assessment & Plan:     ICD-10-CM   1. Chronic respiratory failure with hypoxia (HCC)  J96.11     2. Stage 3 severe COPD by GOLD classification ([place])  J44.9     3. Centrilobular emphysema (HCC)Gold B with asthma overlap syndrome  J43.2     4. Sarcoidosis  D86.9     5. BMI 40.0-44.9, adult [facility])  Z68.41       Discussion:  This is a 71 year old female, severe COPD,  FEV1 of 38% on previous office spirometry, former smoker quit 2012, mMRC functional class III.  Currently on triple therapy inhaler regimen, history of sarcoidosis dating for many years ago no recent radiographic or axial CT image  changes.  Plan: Continue DME supplies for O2 supplementation. Continue triple therapy inhaler regimen with Advair plus Spiriva. Consider enrollment in lung cancer screening program 1 year.   Current Outpatient Medications:    acetaminophen (TYLENOL) 500 MG tablet, Take 2 tablets (1,000 mg total) by mouth every 8 (eight) hours as needed., Disp: 30 tablet, Rfl: 0   albuterol (PROVENTIL) (2.5 MG/3ML) 0.083% nebulizer solution, INHALE 1 VIAL VIA NEBULIZER EVERY 6 HOURS AS NEEDED FOR WHEEZING OR SHORTNESS OF BREATH, Disp: 75 mL, Rfl: 0   albuterol (VENTOLIN HFA) 108 (90 Base) MCG/ACT inhaler, INHALE 2 PUFFS INTO THE LUNGS EVERY 6 HOURS AS NEEDED FOR WHEEZING OR SHORTNESS OF BREATH, Disp: 17 g, Rfl: 3   aspirin 81 MG chewable tablet, Chew 81 mg by mouth as needed (for general health)., Disp: , Rfl:    atorvastatin (LIPITOR) 40 MG tablet, Take 1 tablet (40 mg total) by mouth daily., Disp: 90 tablet, Rfl: 3   cetirizine (ZYRTEC) 10 MG tablet, Take 1 tablet (10 mg total) by mouth 2 (two) times daily as needed for allergies (During flare ups)., Disp: 60 tablet, Rfl: 5   fluticasone (FLONASE) 50 MCG/ACT nasal spray, Place 2 sprays into both nostrils daily., Disp: 16 g, Rfl: 6   fluticasone-salmeterol (ADVAIR HFA) 230-21 MCG/ACT inhaler, Inhale 2 puffs into the lungs 2 (two) times daily., Disp: 12 g, Rfl: 2   ipratropium (ATROVENT) 0.03 % nasal spray, Place 2 sprays into both nostrils 3 (three) times daily., Disp: 30 mL, Rfl: 5   losartan-hydrochlorothiazide (HYZAAR) 100-25 MG tablet, Take 1 tablet by mouth daily., Disp: 90 tablet, Rfl: 3   omeprazole (PRILOSEC) 20 MG capsule, Take 1 capsule (20 mg total) by mouth daily., Disp: 30 capsule, Rfl: 2   Tiotropium Bromide Monohydrate (SPIRIVA RESPIMAT) 2.5 MCG/ACT AERS, Two puff daily, Disp: 4 g, Rfl: 11   tiZANidine (ZANAFLEX) 4 MG tablet, 1/2 - 1 tablet every 6 hours as needed for muscle spasm, Disp: 30 tablet, Rfl: 2   DULoxetine (CYMBALTA) 60 MG capsule,  Take 1 capsule (60 mg total) by mouth daily., Disp: 30 capsule, Rfl: 3   Garner Nash, DO [facility] 02/16/2021 4:35 PM

## 2021-02-21 ENCOUNTER — Encounter (HOSPITAL_COMMUNITY): Payer: Self-pay | Admitting: Internal Medicine

## 2021-02-21 ENCOUNTER — Other Ambulatory Visit (HOSPITAL_COMMUNITY): Payer: Medicare Other

## 2021-04-03 ENCOUNTER — Other Ambulatory Visit: Payer: Self-pay | Admitting: Nurse Practitioner

## 2021-04-03 DIAGNOSIS — F331 Major depressive disorder, recurrent, moderate: Secondary | ICD-10-CM

## 2021-04-03 DIAGNOSIS — G8929 Other chronic pain: Secondary | ICD-10-CM

## 2021-04-04 MED ORDER — DULOXETINE HCL 60 MG PO CPEP: ORAL_CAPSULE | ORAL | 0 refills | Status: DC

## 2021-04-04 MED ORDER — ALBUTEROL SULFATE (2.5 MG/3ML) 0.083% IN NEBU: INHALATION_SOLUTION | RESPIRATORY_TRACT | 0 refills | Status: DC

## 2021-04-04 NOTE — Addendum Note (Signed)
Addended by: Erie Noe on: 04/04/2021 12:04 PM ? ? Modules accepted: Orders ? ?

## 2021-04-04 NOTE — Telephone Encounter (Signed)
Requested Prescriptions  ?Pending Prescriptions Disp Refills  ?? albuterol (PROVENTIL) (2.5 MG/3ML) 0.083% nebulizer solution [Pharmacy Med Name: ALBUTEROL 0.083%(2.5MG/3ML) 25X3ML] 75 mL 0  ?  Sig: USE 1 VIAL VIA NEBULIZER EVERY 6 HOURS AS NEEDED FOR WHEEZING OR SHORTNESS OF BREATH  ?  ? Pulmonology:  Beta Agonists 2 Failed - 04/03/2021  3:14 PM  ?  ?  Failed - Last BP in normal range  ?  BP Readings from Last 1 Encounters:  ?02/16/21 (!) 142/68  ?   ?  ?  Passed - Last Heart Rate in normal range  ?  Pulse Readings from Last 1 Encounters:  ?02/16/21 84  ?   ?  ?  Passed - Valid encounter within last 12 months  ?  Recent Outpatient Visits   ?      ? 8 months ago Type 2 diabetes mellitus with hyperglycemia, without long-term current use of insulin ([place])  ? [place], [place] W, NP  ? 10 months ago Centrilobular emphysema (HCC)Gold B with asthma overlap syndrome  ? [place] Elsie Stain, MD  ? 1 year ago Encounter to establish care  ? [place], [place] W, NP  ? 1 year ago Centrilobular emphysema (HCC)Gold B with asthma overlap syndrome  ? [place] Elsie Stain, MD  ? 1 year ago Centrilobular emphysema (HCC)Gold B with asthma overlap syndrome  ? [place] Elsie Stain, MD  ?  ?  ? ?  ?  ?  ?? DULoxetine (CYMBALTA) 60 MG capsule [Pharmacy Med Name: DULOXETINE DR 60MG CAPSULES] 30 capsule 3  ?  Sig: TAKE 1 CAPSULE(60 MG) BY MOUTH DAILY  ?  ? Psychiatry: Antidepressants - SNRI - duloxetine Failed - 04/03/2021  3:14 PM  ?  ?  Failed - Last BP in normal range  ?  BP Readings from Last 1 Encounters:  ?02/16/21 (!) 142/68  ?   ?  ?  Failed - Valid encounter within last 6 months  ?  Recent Outpatient Visits   ?      ? 8 months ago Type 2 diabetes mellitus with hyperglycemia, without long-term current use of insulin ([place])  ? [place], [place] W, NP  ? 10 months ago Centrilobular emphysema (HCC)Gold B with asthma overlap syndrome  ? [place] Elsie Stain, MD  ? 1 year ago Encounter to establish care  ? [place], [place] W, NP  ? 1 year ago Centrilobular emphysema (HCC)Gold B with asthma overlap syndrome  ? [place] Elsie Stain, MD  ? 1 year ago Centrilobular emphysema (HCC)Gold B with asthma overlap syndrome  ? [place] Elsie Stain, MD  ?  ?  ? ?  ?  ?  Passed - Cr in normal range and within 360 days  ?  Creatinine, Ser  ?Date Value Ref Range Status  ?05/09/2020 0.82 0.57 - 1.00 mg/dL Final  ?   ?  ?  Passed - eGFR is 30 or above and within 360 days  ?  GFR calc Af Amer  ?Date Value Ref Range Status  ?07/20/2019 >60 >60 mL/min Final  ? ?GFR calc non Af Amer  ?Date Value Ref Range Status  ?07/20/2019 >60 >60  mL/min Final  ? ?eGFR  ?Date Value Ref Range Status  ?05/09/2020 77 >59 mL/min/1.73 Final  ?   ?  ?  Passed - Completed PHQ-2 or PHQ-9 in the last 360 days  ?  ?  ? ? ?

## 2021-05-04 ENCOUNTER — Encounter: Payer: Self-pay | Admitting: Pulmonary Disease

## 2021-05-04 DIAGNOSIS — J9611 Chronic respiratory failure with hypoxia: Secondary | ICD-10-CM

## 2021-05-04 DIAGNOSIS — J449 Chronic obstructive pulmonary disease, unspecified: Secondary | ICD-10-CM

## 2021-05-09 NOTE — Telephone Encounter (Signed)
Dr. Valeta Harms, please advise if ok to order a POC for pt. Thanks.  ?

## 2021-05-10 ENCOUNTER — Other Ambulatory Visit: Payer: Self-pay | Admitting: Critical Care Medicine

## 2021-05-11 NOTE — Telephone Encounter (Signed)
Requested medication (s) are due for refill today: yes ? ?Requested medication (s) are on the active medication list: yes ? ?Last refill:  05/10/20 ? ?Future visit scheduled: no ? ?Notes to clinic:  Unable to refill per protocol, appointment needed.  ? ? ?  ?Requested Prescriptions  ?Pending Prescriptions Disp Refills  ? losartan-hydrochlorothiazide (HYZAAR) 100-25 MG tablet [Pharmacy Med Name: LOSARTAN/HCTZ 100/25MG TABLETS] 90 tablet 3  ?  Sig: Take 1 tablet by mouth daily.  ?  ? Cardiovascular: ARB + Diuretic Combos Failed - 05/10/2021  7:25 PM  ?  ?  Failed - K in normal range and within 180 days  ?  Potassium  ?Date Value Ref Range Status  ?05/09/2020 4.4 3.5 - 5.2 mmol/L Final  ?  ?  ?  ?  Failed - Na in normal range and within 180 days  ?  Sodium  ?Date Value Ref Range Status  ?05/09/2020 147 (H) 134 - 144 mmol/L Final  ?  ?  ?  ?  Failed - Cr in normal range and within 180 days  ?  Creatinine, Ser  ?Date Value Ref Range Status  ?05/09/2020 0.82 0.57 - 1.00 mg/dL Final  ?  ?  ?  ?  Failed - eGFR is 10 or above and within 180 days  ?  GFR calc Af Amer  ?Date Value Ref Range Status  ?07/20/2019 >60 >60 mL/min Final  ? ?GFR calc non Af Amer  ?Date Value Ref Range Status  ?07/20/2019 >60 >60 mL/min Final  ? ?eGFR  ?Date Value Ref Range Status  ?05/09/2020 77 >59 mL/min/1.73 Final  ?  ?  ?  ?  Failed - Last BP in normal range  ?  BP Readings from Last 1 Encounters:  ?02/16/21 (!) 142/68  ?  ?  ?  ?  Failed - Valid encounter within last 6 months  ?  Recent Outpatient Visits   ? ?      ? 9 months ago Type 2 diabetes mellitus with hyperglycemia, without long-term current use of insulin ([place])  ? [place], [place] W, NP  ? 1 year ago Centrilobular emphysema (HCC)Gold B with asthma overlap syndrome  ? [place] Elsie Stain, MD  ? 1 year ago Encounter to establish care  ? [place], [place] W, NP  ? 1 year ago  Centrilobular emphysema (HCC)Gold B with asthma overlap syndrome  ? [place] Elsie Stain, MD  ? 1 year ago Centrilobular emphysema (HCC)Gold B with asthma overlap syndrome  ? [place] Elsie Stain, MD  ? ?  ?  ? ? ?  ?  ?  Passed - Patient is not pregnant  ?  ?  ? ? ?

## 2021-05-18 ENCOUNTER — Encounter: Payer: Self-pay | Admitting: Pulmonary Disease

## 2021-05-24 ENCOUNTER — Telehealth: Payer: Self-pay | Admitting: Pulmonary Disease

## 2021-05-24 NOTE — Telephone Encounter (Signed)
ATC Dwayne to see if he can refax the form to [facility]'s fax machine at [phone]. Left detailed message for him will forward to her to keep an eye out ?

## 2021-05-25 NOTE — Telephone Encounter (Signed)
We have the CMN but [employer] is not in the office till Monday 05/29/21 ?

## 2021-05-25 NOTE — Telephone Encounter (Signed)
ATC Dwayne from [employer]. No VM but left him a message letting him know it would be signed and faxed on Monday 05/29/21.  ?

## 2021-06-28 ENCOUNTER — Encounter: Payer: Self-pay | Admitting: Pulmonary Disease

## 2021-06-28 ENCOUNTER — Encounter: Payer: Self-pay | Admitting: Nurse Practitioner

## 2021-06-28 ENCOUNTER — Other Ambulatory Visit: Payer: Self-pay | Admitting: Nurse Practitioner

## 2021-06-28 DIAGNOSIS — G8929 Other chronic pain: Secondary | ICD-10-CM

## 2021-06-28 MED ORDER — TIZANIDINE HCL 4 MG PO TABS: ORAL_TABLET | ORAL | 2 refills | Status: DC

## 2021-06-29 ENCOUNTER — Other Ambulatory Visit: Payer: Self-pay | Admitting: Nurse Practitioner

## 2021-06-29 DIAGNOSIS — F331 Major depressive disorder, recurrent, moderate: Secondary | ICD-10-CM

## 2021-06-29 DIAGNOSIS — G8929 Other chronic pain: Secondary | ICD-10-CM

## 2021-06-29 NOTE — Telephone Encounter (Signed)
Can she do a visit today at 3 pm , I can see in office for visit or video

## 2021-06-29 NOTE — Telephone Encounter (Signed)
Received email from pt:  Hello    Allergies ongoing especially the last week  Taking meds as prescribed.And used nebulizer. Spiriva out of stock Friday also. Any chance of a few doses of prednisone? I usually respond well to that . No fever and sputum clear                                         Thanks                                          [place] and spoke with the pt She has some wheezing and cough with clear sputum past 3 days  Started after she had her grass mowed  She has no SOB or fever  She is taking advair, flonase, atrovent NS, zyrtec, albuterol  Out of spiriva until pharm gets in stock tomorrow  She is asking for short pred course  Please advise thanks!  No Active Allergies

## 2021-06-30 ENCOUNTER — Other Ambulatory Visit: Payer: Self-pay | Admitting: Internal Medicine

## 2021-06-30 ENCOUNTER — Telehealth: Payer: Self-pay | Admitting: Internal Medicine

## 2021-06-30 MED ORDER — SPIRIVA RESPIMAT 2.5 MCG/ACT IN AERS: INHALATION_SPRAY | RESPIRATORY_TRACT | 3 refills | Status: DC

## 2021-07-06 ENCOUNTER — Telehealth (INDEPENDENT_AMBULATORY_CARE_PROVIDER_SITE_OTHER): Payer: Medicare Other | Admitting: Nurse Practitioner

## 2021-07-06 ENCOUNTER — Encounter: Payer: Self-pay | Admitting: Nurse Practitioner

## 2021-07-06 DIAGNOSIS — D869 Sarcoidosis, unspecified: Secondary | ICD-10-CM

## 2021-07-06 DIAGNOSIS — J9611 Chronic respiratory failure with hypoxia: Secondary | ICD-10-CM | POA: Diagnosis not present

## 2021-07-06 DIAGNOSIS — J45901 Unspecified asthma with (acute) exacerbation: Secondary | ICD-10-CM

## 2021-07-06 DIAGNOSIS — J4489 Other specified chronic obstructive pulmonary disease: Secondary | ICD-10-CM | POA: Insufficient documentation

## 2021-07-06 DIAGNOSIS — J441 Chronic obstructive pulmonary disease with (acute) exacerbation: Secondary | ICD-10-CM | POA: Diagnosis not present

## 2021-07-06 MED ORDER — PREDNISONE 10 MG PO TABS: ORAL_TABLET | ORAL | 0 refills | Status: DC

## 2021-07-06 MED ORDER — DOXYCYCLINE HYCLATE 100 MG PO TABS
100.0000 mg | ORAL_TABLET | Freq: Two times a day (BID) | ORAL | 0 refills | Status: AC
Start: 2021-07-06 — End: 2021-07-13

## 2021-07-06 NOTE — Assessment & Plan Note (Signed)
Oxygen saturations have remained above 88%.  She does feel better wearing her oxygen so has been wearing it more frequently throughout the day.  Remains on 2 L/min supplemental O2.  Goal greater than 88 to 90%.

## 2021-07-06 NOTE — Assessment & Plan Note (Signed)
No systemic symptoms of sarcoid. CT from 12/2020 stable.

## 2021-07-06 NOTE — Patient Instructions (Signed)
Continue Albuterol inhaler 2 puffs or 3 mL neb every 6 hours as needed for shortness of breath or wheezing. Notify if symptoms persist despite rescue inhaler/neb use. Continue Spiriva 2 puffs daily Continue Advair 2 puffs Twice daily. Brush tongue and rinse mouth afterwards  Continue Zyrtec 10 mg twice daily as needed for allergies Continue Flonase 2 sprays each nostril daily as needed for allergies Continue Atrovent nasal spray 2 sprays each nostril 3 times a day  Prednisone taper. 4 tabs for 2 days, then 3 tabs for 2 days, 2 tabs for 2 days, then 1 tab for 2 days, then stop.  Take in a.m. with food Doxycycline 1 tab Twice daily for 7 days. Take with food. Wear sunscreen when outside   Mucinex 600 mg Twice daily   Follow up in one month with Dr. Tonia Brooms or Florentina Addison Boneta Standre,NP. If symptoms do not improve or worsen, please contact office for sooner follow up or seek emergency care.

## 2021-07-06 NOTE — Progress Notes (Signed)
Patient ID: Carolyn Morton, female     DOB: 21-Nov-1950, 71 y.o.      MRN: [id number]  Chief Complaint  Patient presents with   Follow-up    Patient has been wheezing and says she has an increased cough with yellow thick sputum.     Virtual Visit via Video Note  I connected with Carolyn Morton on 07/06/21 at  2:00 PM EDT by a video enabled telemedicine application and verified that I am speaking with the correct person using two identifiers.  Location: Patient: Home Provider: Office   I discussed the limitations of evaluation and management by telemedicine and the availability of in person appointments. The patient expressed understanding and agreed to proceed.  History of Present Illness: 71 year old female, former smoker followed for severe COPD with asthma overlap, sarcoidosis, chronic respiratory failure. She is a patient of Dr. Myrlene Broker and last seen in office on 02/16/2021.  Past medical history significant for hypertension, allergic rhinitis, GERD, depression, obesity.  02/16/2021: OV with Dr. Tonia Brooms for follow-up.  Recent CT scan of the chest was reassuring.  No systemic evidence of sarcoidosis.  No longer smoking.  Using continuous supplemental O2 as needed.  Had issues with POC before and felt like she was not getting enough.  Continued on triple therapy regimen with Advair and Spiriva.  Recommended considering enrollment in lung cancer screening program.  07/06/2021: Today-acute Patient presents today via virtual visit for increased wheezing and productive cough with yellow sputum.  Her symptoms started around a week ago but at that time her cough was minimally productive.  She has also noticed some increased shortness of breath with exertion.  She did run out of her Spiriva around the same time.  The pharmacy had been out of stock.  She was able to get this refilled this week and has since switched to [facility] for delivery meds. Denies any fevers, chills, night sweats, anorexia,  hemoptysis, lower extremity swelling.  She has been using her oxygen more frequently during the day. Oxygen levels have remained above 88% on 2 L/min. No systemic symptoms of sarcoid.  No Active Allergies Immunization History  Administered Date(s) Administered   PFIZER(Purple Top)SARS-COV-2 Vaccination 05/14/2019, 06/04/2019   Pneumococcal Conjugate-13 07/13/2019   Tdap 07/13/2019   Past Medical History:  Diagnosis Date   COPD (chronic obstructive pulmonary disease) (HCC)    Hypertension    Sarcoidosis     Tobacco History: Social History   Tobacco Use  Smoking Status Former   Types: Cigarettes   Quit date: 01/22/2010   Years since quitting: 11.4  Smokeless Tobacco Never   Counseling given: Not Answered   Outpatient Medications Prior to Visit  Medication Sig Dispense Refill   acetaminophen (TYLENOL) 500 MG tablet Take 2 tablets (1,000 mg total) by mouth every 8 (eight) hours as needed. 30 tablet 0   albuterol (PROVENTIL) (2.5 MG/3ML) 0.083% nebulizer solution USE 1 VIAL VIA NEBULIZER EVERY 6 HOURS AS NEEDED FOR WHEEZING OR SHORTNESS OF BREATH 75 mL 0   albuterol (VENTOLIN HFA) 108 (90 Base) MCG/ACT inhaler INHALE 2 PUFFS INTO THE LUNGS EVERY 6 HOURS AS NEEDED FOR WHEEZING OR SHORTNESS OF BREATH 17 g 3   aspirin 81 MG chewable tablet Chew 81 mg by mouth as needed (for general health).     atorvastatin (LIPITOR) 40 MG tablet Take 1 tablet (40 mg total) by mouth daily. 90 tablet 3   cetirizine (ZYRTEC) 10 MG tablet Take 1 tablet (10 mg total) by mouth 2 (  two) times daily as needed for allergies (During flare ups). 60 tablet 5   DULoxetine (CYMBALTA) 60 MG capsule TAKE 1 CAPSULE(60 MG) BY MOUTH DAILY 90 capsule 0   fluticasone (FLONASE) 50 MCG/ACT nasal spray Place 2 sprays into both nostrils daily. 16 g 6   fluticasone-salmeterol (ADVAIR HFA) 230-21 MCG/ACT inhaler Inhale 2 puffs into the lungs 2 (two) times daily. 12 g 2   ipratropium (ATROVENT) 0.03 % nasal spray Place 2 sprays  into both nostrils 3 (three) times daily. 30 mL 5   losartan-hydrochlorothiazide (HYZAAR) 100-25 MG tablet Take 1 tablet by mouth daily. 90 tablet 3   omeprazole (PRILOSEC) 20 MG capsule Take 1 capsule (20 mg total) by mouth daily. 30 capsule 2   Tiotropium Bromide Monohydrate (SPIRIVA RESPIMAT) 2.5 MCG/ACT AERS Two puff daily 12 g 3   tiZANidine (ZANAFLEX) 4 MG tablet 1/2 - 1 tablet every 6 hours as needed for muscle spasm 30 tablet 2   No facility-administered medications prior to visit.     Review of Systems:   Constitutional: No weight loss or gain, night sweats, fevers, chills, fatigue, or lassitude. HEENT: No headaches, difficulty swallowing, tooth/dental problems, or sore throat. No sneezing, itching, ear ache, nasal congestion, or post nasal drip CV:  No chest pain, orthopnea, PND, swelling in lower extremities, anasarca, dizziness, palpitations, syncope Resp: +shortness of breath with exertion; wheezing; productive cough. No hemoptysis. No chest wall deformity GI:  No heartburn, indigestion, abdominal pain, nausea, vomiting, diarrhea, change in bowel habits, loss of appetite, bloody stools.  Skin: No rash, lesions, ulcerations MSK:  No joint pain or swelling.  No decreased range of motion.  No back pain. Neuro: No dizziness or lightheadedness.  Psych: No depression or anxiety. Mood stable.   Observations/Objective: Patient is well-developed, well-nourished in no acute distress; A&Ox3. Resting comfortably at home. Unlabored, regular breathing on supplemental oxygen. Speech is clear and coherent with logical content.   12/14/2020 PFTs: FVC 52, FEV1 42, ratio 75, TLC 81, DLCOcor 43.  Severe obstructive airway disease with reversibility (32% change) and severe diffusion defect  12/26/2020 HRCT chest: Atherosclerosis is present.  There is a small pericardial effusion.  There is minimal, predominantly dependent, bland appearing scarring of the bilateral lung bases.  No significant  nodularity or overt fibrosis.  No significant air trapping.  There is a benign 0.6 cm fissural nodule of the right middle lobe.   Assessment and Plan: Acute exacerbation of COPD with asthma (HCC) AECOPD/asthma with increased productive cough, wheezing and shortness of breath.  She is nontoxic-appearing.  Will empirically treat with doxycycline and prednisone taper. She was advised to take with food and wear sunscreen when she is outside.  Recommended that she also take Mucinex for mucolytic therapy.  Continue triple therapy regimen with Advair and Spiriva.  Continue albuterol as needed.  Patient Instructions  Continue Albuterol inhaler 2 puffs or 3 mL neb every 6 hours as needed for shortness of breath or wheezing. Notify if symptoms persist despite rescue inhaler/neb use. Continue Spiriva 2 puffs daily Continue Advair 2 puffs Twice daily. Brush tongue and rinse mouth afterwards  Continue Zyrtec 10 mg twice daily as needed for allergies Continue Flonase 2 sprays each nostril daily as needed for allergies Continue Atrovent nasal spray 2 sprays each nostril 3 times a day  Prednisone taper. 4 tabs for 2 days, then 3 tabs for 2 days, 2 tabs for 2 days, then 1 tab for 2 days, then stop.  Take in a.m. with  food Doxycycline 1 tab Twice daily for 7 days. Take with food. Wear sunscreen when outside   Mucinex 600 mg Twice daily   Follow up in one month with Dr. Tonia Brooms or Florentina Addison Teodor Prater,NP. If symptoms do not improve or worsen, please contact office for sooner follow up or seek emergency care.     Chronic respiratory failure with hypoxia (HCC) Oxygen saturations have remained above 88%.  She does feel better wearing her oxygen so has been wearing it more frequently throughout the day.  Remains on 2 L/min supplemental O2.  Goal greater than 88 to 90%.  Sarcoidosis No systemic symptoms of sarcoid. CT from 12/2020 stable.      I discussed the assessment and treatment plan with the patient. The patient  was provided an opportunity to ask questions and all were answered. The patient agreed with the plan and demonstrated an understanding of the instructions.   The patient was advised to call back or seek an in-person evaluation if the symptoms worsen or if the condition fails to improve as anticipated.  I provided 31 minutes of non-face-to-face time during this encounter.   Noemi Chapel, NP

## 2021-07-06 NOTE — Assessment & Plan Note (Addendum)
AECOPD/asthma with increased productive cough, wheezing and shortness of breath.  She is nontoxic-appearing.  Will empirically treat with doxycycline and prednisone taper. She was advised to take with food and wear sunscreen when she is outside.  Recommended that she also take Mucinex for mucolytic therapy.  Continue triple therapy regimen with Advair and Spiriva.  Continue albuterol as needed.  Patient Instructions  Continue Albuterol inhaler 2 puffs or 3 mL neb every 6 hours as needed for shortness of breath or wheezing. Notify if symptoms persist despite rescue inhaler/neb use. Continue Spiriva 2 puffs daily Continue Advair 2 puffs Twice daily. Brush tongue and rinse mouth afterwards  Continue Zyrtec 10 mg twice daily as needed for allergies Continue Flonase 2 sprays each nostril daily as needed for allergies Continue Atrovent nasal spray 2 sprays each nostril 3 times a day  Prednisone taper. 4 tabs for 2 days, then 3 tabs for 2 days, 2 tabs for 2 days, then 1 tab for 2 days, then stop.  Take in a.m. with food Doxycycline 1 tab Twice daily for 7 days. Take with food. Wear sunscreen when outside   Mucinex 600 mg Twice daily   Follow up in one month with Dr. Tonia Brooms or Carolyn Addison Marivel Mcclarty,NP. If symptoms do not improve or worsen, please contact office for sooner follow up or seek emergency care.

## 2021-07-10 ENCOUNTER — Encounter: Payer: Self-pay | Admitting: Pulmonary Disease

## 2021-07-10 ENCOUNTER — Encounter: Payer: Self-pay | Admitting: Nurse Practitioner

## 2021-07-10 MED ORDER — SPIRIVA RESPIMAT 2.5 MCG/ACT IN AERS: INHALATION_SPRAY | RESPIRATORY_TRACT | 3 refills | Status: DC

## 2021-07-10 MED ORDER — ALBUTEROL SULFATE (2.5 MG/3ML) 0.083% IN NEBU
INHALATION_SOLUTION | RESPIRATORY_TRACT | 3 refills | Status: AC
Start: 2021-07-10 — End: ?

## 2021-07-10 MED ORDER — ALBUTEROL SULFATE HFA 108 (90 BASE) MCG/ACT IN AERS: 2.0000 | INHALATION_SPRAY | Freq: Four times a day (QID) | RESPIRATORY_TRACT | 3 refills | Status: DC | PRN

## 2021-07-10 MED ORDER — FLUTICASONE-SALMETEROL 230-21 MCG/ACT IN AERO: 2.0000 | INHALATION_SPRAY | Freq: Two times a day (BID) | RESPIRATORY_TRACT | 3 refills | Status: DC

## 2021-07-18 ENCOUNTER — Other Ambulatory Visit: Payer: Self-pay | Admitting: Nurse Practitioner

## 2021-07-18 DIAGNOSIS — F331 Major depressive disorder, recurrent, moderate: Secondary | ICD-10-CM

## 2021-07-18 DIAGNOSIS — G8929 Other chronic pain: Secondary | ICD-10-CM

## 2021-07-18 MED ORDER — DULOXETINE HCL 60 MG PO CPEP: ORAL_CAPSULE | ORAL | 0 refills | Status: AC

## 2021-07-18 MED ORDER — LOSARTAN POTASSIUM-HCTZ 100-25 MG PO TABS: 1.0000 | ORAL_TABLET | Freq: Every day | ORAL | 0 refills | Status: DC

## 2021-08-02 ENCOUNTER — Encounter: Payer: Self-pay | Admitting: Pulmonary Disease

## 2021-08-02 DIAGNOSIS — J449 Chronic obstructive pulmonary disease, unspecified: Secondary | ICD-10-CM

## 2021-08-03 NOTE — Telephone Encounter (Signed)
Will forward to PCCs to send POC/O2 order to [facility].

## 2021-08-07 NOTE — Telephone Encounter (Signed)
Order has been sent to [facility]. Confirmation received from Cameron at [facility] on 7/14 at 10:22 am.

## 2021-08-14 ENCOUNTER — Encounter: Payer: Self-pay | Admitting: Pulmonary Disease

## 2021-08-16 ENCOUNTER — Ambulatory Visit: Payer: Medicare Other | Admitting: Physician Assistant

## 2021-08-21 ENCOUNTER — Encounter: Payer: Self-pay | Admitting: Pulmonary Disease

## 2021-08-22 ENCOUNTER — Encounter: Payer: Self-pay | Admitting: Pulmonary Disease

## 2021-09-13 NOTE — Telephone Encounter (Signed)
Mychart message sent by pt: Carolyn Morton [facility] (supporting Rachel Bo Icard, DO) 4 minutes ago (4:54 PM)    Hello and thank you for all your support.  I sadly didn't get to [place] in time and my son passed.  I'm still  in [place].  Can you possibly fax a letter  to [facility] [employer] ,validating I'm visiting  and I am patient with use of Oxygen                                    With Thanks     Fax.[phone]    Dr. Tonia Brooms, please advise.

## 2021-09-14 ENCOUNTER — Encounter: Payer: Self-pay | Admitting: *Deleted

## 2021-09-17 ENCOUNTER — Encounter: Payer: Self-pay | Admitting: Nurse Practitioner

## 2021-09-18 ENCOUNTER — Telehealth: Payer: Self-pay | Admitting: Nurse Practitioner

## 2021-09-18 NOTE — Telephone Encounter (Signed)
Patient called in about having Losartan sent to [facility] pharmacy, because she is there for her Son's passing and is low on it. I gave her the message that Dr Meredeth Ide is  unable to send this to [facility], she is till requesting this, saying it it has  been done before. Please call back ot discuss further.

## 2021-09-19 ENCOUNTER — Ambulatory Visit (HOSPITAL_BASED_OUTPATIENT_CLINIC_OR_DEPARTMENT_OTHER): Payer: Medicare Other | Admitting: Nurse Practitioner

## 2021-09-19 ENCOUNTER — Encounter: Payer: Self-pay | Admitting: Nurse Practitioner

## 2021-09-19 DIAGNOSIS — I10 Essential (primary) hypertension: Secondary | ICD-10-CM | POA: Diagnosis not present

## 2021-09-19 MED ORDER — LOSARTAN POTASSIUM-HCTZ 100-25 MG PO TABS: 1.0000 | ORAL_TABLET | Freq: Every day | ORAL | 0 refills | Status: DC

## 2021-09-19 NOTE — Progress Notes (Signed)
Virtual Visit via Telephone Note  I discussed the limitations, risks, security and privacy concerns of performing an evaluation and management service by telephone and the availability of in person appointments. I also discussed with the patient that there may be a patient responsible charge related to this service. The patient expressed understanding and agreed to proceed.    I connected with Tereasa Coop on 09/19/21  at   9:30 AM EDT  EDT by telephone and verified that I am speaking with the correct person using two identifiers.  Location of Patient: Private Residence   Location of Provider: [facility]    Persons participating in Telemedicine visit: Bertram Denver FNP-BC Turkey Pavlov    History of Present Illness: Telemedicine visit for: HTN/Medication refill  She has a past medical history of COPD, Hypertension, and Sarcoidosis.   Ms Carolyn Morton states she missed her most recent appt because her son fell ill and she had to go to [place]. Unfortunately he passed and she is still in [place]. Requesting a refill of her blood pressure medication today. She is aware this is a 30 day refill only and she will need to come to the office for blood work and BP check.    HTN She has not been checking her blood pressure at home. Has been out of her hyzaar for a few days now.  BP Readings from Last 3 Encounters:  02/16/21 (!) 142/68  02/09/21 120/72  12/14/20 122/74       Past Medical History:  Diagnosis Date   COPD (chronic obstructive pulmonary disease) (HCC)    Hypertension    Sarcoidosis     Past Surgical History:  Procedure Laterality Date   CHOLECYSTECTOMY N/A 07/19/2019   Procedure: LAPAROSCOPIC CHOLECYSTECTOMY WITH INTRAOPERATIVE CHOLANGIOGRAM;  Surgeon: Gaynelle Adu, MD;  Location: [facility] OR;  Service: General;  Laterality: N/A;    Family History  Family history unknown: Yes    Social History   Socioeconomic History   Marital status: Widowed    Spouse  name: Not on file   Number of children: Not on file   Years of education: Not on file   Highest education level: Not on file  Occupational History   Not on file  Tobacco Use   Smoking status: Former    Types: Cigarettes    Quit date: 01/22/2010    Years since quitting: 11.6   Smokeless tobacco: Never  Vaping Use   Vaping Use: Former  Substance and Sexual Activity   Alcohol use: Not Currently   Drug use: Never   Sexual activity: Not on file  Other Topics Concern   Not on file  Social History Narrative   Not on file   Social Determinants of Health   Financial Resource Strain: Not on file  Food Insecurity: Not on file  Transportation Needs: Not on file  Physical Activity: Not on file  Stress: Not on file  Social Connections: Not on file     Observations/Objective: Awake, alert and oriented x 3   Review of Systems  Constitutional:  Negative for fever, malaise/fatigue and weight loss.  HENT: Negative.  Negative for nosebleeds.   Eyes: Negative.  Negative for blurred vision, double vision and photophobia.  Respiratory: Negative.  Negative for cough and shortness of breath.   Cardiovascular: Negative.  Negative for chest pain, palpitations and leg swelling.  Gastrointestinal: Negative.  Negative for heartburn, nausea and vomiting.  Musculoskeletal: Negative.  Negative for myalgias.  Neurological: Negative.  Negative for  dizziness, focal weakness, seizures and headaches.  Psychiatric/Behavioral: Negative.  Negative for suicidal ideas.     Assessment and Plan: Diagnoses and all orders for this visit:  Essential hypertension -     losartan-hydrochlorothiazide (HYZAAR) 100-25 MG tablet; Take 1 tablet by mouth daily. NO Additional refills Continue hyzaar as prescribed.  Reminded to bring in blood pressure log for follow  up appointment.  RECOMMENDATIONS: DASH/Mediterranean Diets are healthier choices for HTN.      Follow Up Instructions Return in about 4 weeks (around  10/17/2021) for HTN.     I discussed the assessment and treatment plan with the patient. The patient was provided an opportunity to ask questions and all were answered. The patient agreed with the plan and demonstrated an understanding of the instructions.   The patient was advised to call back or seek an in-person evaluation if the symptoms worsen or if the condition fails to improve as anticipated.  I provided 11 minutes of non-face-to-face time during this encounter including median intraservice time, reviewing previous notes, labs, imaging, medications and explaining diagnosis and management.  Claiborne Rigg, FNP-BC

## 2021-09-20 NOTE — Telephone Encounter (Signed)
I spoke to patient and she instructed me to send to the local pharmacy here and she would have the prescription transferred. She is aware that I do not have prescriptive authority in [place] and stated to me she has had prescriptions transferred in the past and would have it done that way.

## 2021-10-24 ENCOUNTER — Encounter: Payer: Self-pay | Admitting: Pulmonary Disease

## 2021-11-02 ENCOUNTER — Other Ambulatory Visit: Payer: Self-pay | Admitting: Critical Care Medicine

## 2021-11-02 ENCOUNTER — Encounter: Payer: Self-pay | Admitting: Pulmonary Disease

## 2021-11-02 ENCOUNTER — Telehealth: Payer: Self-pay | Admitting: Pulmonary Disease

## 2021-11-02 MED ORDER — FLUTICASONE-SALMETEROL 230-21 MCG/ACT IN AERO: 2.0000 | INHALATION_SPRAY | Freq: Two times a day (BID) | RESPIRATORY_TRACT | 3 refills | Status: DC

## 2021-11-02 MED ORDER — SPIRIVA RESPIMAT 2.5 MCG/ACT IN AERS: INHALATION_SPRAY | RESPIRATORY_TRACT | 3 refills | Status: DC

## 2021-11-02 NOTE — Telephone Encounter (Signed)
Spoke with pt to let her know that meds were sent in to pharmacy for her. Nothing further needed.

## 2021-11-02 NOTE — Telephone Encounter (Signed)
Rx for pt's spiriva and advair have been sent to preferred pharmacy for pt. Called and spoke with pt letting her know this had been done and she verbalized understanding. Nothing further needed.

## 2021-12-04 ENCOUNTER — Encounter: Payer: Self-pay | Admitting: Pulmonary Disease

## 2021-12-06 ENCOUNTER — Telehealth: Payer: Self-pay | Admitting: Pulmonary Disease

## 2021-12-06 ENCOUNTER — Encounter: Payer: Self-pay | Admitting: Pulmonary Disease

## 2021-12-06 MED ORDER — SPIRIVA RESPIMAT 2.5 MCG/ACT IN AERS: 2.0000 | INHALATION_SPRAY | Freq: Every day | RESPIRATORY_TRACT | 0 refills | Status: DC

## 2021-12-06 NOTE — Telephone Encounter (Signed)
Mychart message sent by pt stating that she is unable to get Spiriva from the pharmacy due to her cost now being $400. Pt has tried to apply for pt assistance but she was not able to get approved.  Pt is not able to come to the office to pick up a sample due to not having any way to come get it.   Is there any other recommendations for pt other than the Advair that she is currently on that might be able to be sent in for pt or could prednisone be sent in for pt as she states that she is having problems with increased SOB and is also beginning to wheeze due to not being able to get her Spiriva.  Routing to provider of the day. Dr. Delton Coombes, please advise.

## 2021-12-06 NOTE — Addendum Note (Signed)
Addended by: Wyvonne Lenz on: 12/06/2021 12:13 PM   Modules accepted: Orders

## 2021-12-06 NOTE — Telephone Encounter (Signed)
Pt called back and stated that she has found someone to come by to pick up some samples of spiriva. Nothing further needed.

## 2021-12-06 NOTE — Telephone Encounter (Signed)
I don't think I would treat her with prednisone in absence of an acute flare.  It sounds like she needs to be seen in office and to have assessment done for why she can't get spiriva - ? Donut hole, ? No longer covered (doubt).

## 2021-12-07 NOTE — Telephone Encounter (Signed)
Pt was able to find someone to come and get spiriva samples so all has been taken care of. Nothing further needed.

## 2021-12-20 ENCOUNTER — Encounter: Payer: Self-pay | Admitting: Pulmonary Disease

## 2021-12-21 NOTE — Telephone Encounter (Signed)
PCCs, please advise if pt is able to change DMEs. The first referral in her chart to [facility] is in 09/2020. Thanks.

## 2021-12-22 NOTE — Telephone Encounter (Signed)
I have verified that pt is not able to switch dme's at this time due to already having O2 since 09/2020.  I contacted [facility] rep Melissa and she states their policy is if a pt has a POC that means they are portable and able to move about & they don't normally make deliveries to them - if someone is bedridden they would make a delivery.  Melissa states she will have someone to reach out to the patient.  I will route message back to triage so patient can be made aware thru MyChart.

## 2022-01-04 ENCOUNTER — Other Ambulatory Visit: Payer: Self-pay

## 2022-01-04 ENCOUNTER — Telehealth: Payer: Self-pay | Admitting: Nurse Practitioner

## 2022-01-04 ENCOUNTER — Telehealth: Payer: Self-pay

## 2022-01-04 DIAGNOSIS — I10 Essential (primary) hypertension: Secondary | ICD-10-CM

## 2022-01-04 MED ORDER — LOSARTAN POTASSIUM-HCTZ 100-25 MG PO TABS: 1.0000 | ORAL_TABLET | Freq: Every day | ORAL | 0 refills | Status: DC

## 2022-01-04 NOTE — Telephone Encounter (Signed)
losartan-hydrochlorothiazide (HYZAAR) 100-25 MG tablet  Pt is been without this med/ claims knows no one from here and is on oxygen. Has not been seen by PCP. Wants consideration Via  Telephone Visit but none available short term. Pt ask for a fu from PCP [phone] [id number] - [place], [place] - [address] AT [address] & [address] Phone: [phone]  Fax: [phone]

## 2022-01-04 NOTE — Telephone Encounter (Addendum)
Patient voiced concerns about having a hard time with getting to and from MD appointments. Phone patient . message left with information as to get assistance with transportation to MD appointments. Pt instructed to call back with additional questions

## 2022-01-04 NOTE — Telephone Encounter (Signed)
Pt. Given one refill. Pt informed which pharmacy received refill orders. Explained to patient that additional  medication refills will not be authorized until she completes follow-up visit with PCP.  Offered to schedule appointment with PCP , pt declined.Patient verbalized understanding of all dicussed.

## 2022-01-06 ENCOUNTER — Encounter: Payer: Self-pay | Admitting: Nurse Practitioner

## 2022-01-06 NOTE — Telephone Encounter (Signed)
No medication refills until she has an office visit.

## 2022-01-09 ENCOUNTER — Ambulatory Visit: Payer: Medicare Other | Attending: Nurse Practitioner

## 2022-01-09 VITALS — BP 155/77 | HR 69 | Ht 60.0 in | Wt 210.0 lb

## 2022-01-09 DIAGNOSIS — Z0000 Encounter for general adult medical examination without abnormal findings: Secondary | ICD-10-CM

## 2022-01-09 NOTE — Patient Instructions (Signed)
[name] , Thank you for taking time to come for your Medicare Wellness Visit. I appreciate your ongoing commitment to your health goals. Please review the following plan we discussed and let me know if I can assist you in the future.   These are the goals we discussed:  Goals      Patient Stated     01/09/2022, wants to get a little stronger and get more mobility        This is a list of the screening recommended for you and due dates:  Health Maintenance  Topic Date Due   Zoster (Shingles) Vaccine (1 of 2) Never done   COVID-19 Vaccine (3 - 2023-24 season) 09/22/2021   Pneumonia Vaccine (2 - PPSV23 or PCV20) 01/10/2023*   Mammogram  01/10/2023*   Cologuard (Stool DNA test)  01/10/2023*   Medicare Annual Wellness Visit  01/10/2023   DTaP/Tdap/Td vaccine (2 - Td or Tdap) 07/12/2029   HPV Vaccine  Aged Out   Flu Shot  Discontinued   DEXA scan (bone density measurement)  Discontinued   Hepatitis C Screening: USPSTF Recommendation to screen - Ages 71-79 yo.  Discontinued  *Topic was postponed. The date shown is not the original due date.    Advanced directives: Please bring a copy of your POA (Power of Attorney) and/or Living Will to your next appointment.   Conditions/risks identified: none  Next appointment: Follow up in one year for your annual wellness visit    Preventive Care 65 Years and Older, Female Preventive care refers to lifestyle choices and visits with your health care provider that can promote health and wellness. What does preventive care include? A yearly physical exam. This is also called an annual well check. Dental exams once or twice a year. Routine eye exams. Ask your health care provider how often you should have your eyes checked. Personal lifestyle choices, including: Daily care of your teeth and gums. Regular physical activity. Eating a healthy diet. Avoiding tobacco and drug use. Limiting alcohol use. Practicing safe sex. Taking low-dose  aspirin every day. Taking vitamin and mineral supplements as recommended by your health care provider. What happens during an annual well check? The services and screenings done by your health care provider during your annual well check will depend on your age, overall health, lifestyle risk factors, and family history of disease. Counseling  Your health care provider may ask you questions about your: Alcohol use. Tobacco use. Drug use. Emotional well-being. Home and relationship well-being. Sexual activity. Eating habits. History of falls. Memory and ability to understand (cognition). Work and work Astronomer. Reproductive health. Screening  You may have the following tests or measurements: Height, weight, and BMI. Blood pressure. Lipid and cholesterol levels. These may be checked every 5 years, or more frequently if you are over 74 years old. Skin check. Lung cancer screening. You may have this screening every year starting at age 24 if you have a 30-pack-year history of smoking and currently smoke or have quit within the past 15 years. Fecal occult blood test (FOBT) of the stool. You may have this test every year starting at age 62. Flexible sigmoidoscopy or colonoscopy. You may have a sigmoidoscopy every 5 years or a colonoscopy every 10 years starting at age 36. Hepatitis C blood test. Hepatitis B blood test. Sexually transmitted disease (STD) testing. Diabetes screening. This is done by checking your blood sugar (glucose) after you have not eaten for a while (fasting). You may have this done every  1-3 years. Bone density scan. This is done to screen for osteoporosis. You may have this done starting at age 74. Mammogram. This may be done every 1-2 years. Talk to your health care provider about how often you should have regular mammograms. Talk with your health care provider about your test results, treatment options, and if necessary, the need for more tests. Vaccines  Your  health care provider may recommend certain vaccines, such as: Influenza vaccine. This is recommended every year. Tetanus, diphtheria, and acellular pertussis (Tdap, Td) vaccine. You may need a Td booster every 10 years. Zoster vaccine. You may need this after age 33. Pneumococcal 13-valent conjugate (PCV13) vaccine. One dose is recommended after age 80. Pneumococcal polysaccharide (PPSV23) vaccine. One dose is recommended after age 12. Talk to your health care provider about which screenings and vaccines you need and how often you need them. This information is not intended to replace advice given to you by your health care provider. Make sure you discuss any questions you have with your health care provider. Document Released: 02/04/2015 Document Revised: 09/28/2015 Document Reviewed: 11/09/2014 Elsevier Interactive Patient Education  2017 [place] Prevention in the Home Falls can cause injuries. They can happen to people of all ages. There are many things you can do to make your home safe and to help prevent falls. What can I do on the outside of my home? Regularly fix the edges of walkways and driveways and fix any cracks. Remove anything that might make you trip as you walk through a door, such as a raised step or threshold. Trim any bushes or trees on the path to your home. Use bright outdoor lighting. Clear any walking paths of anything that might make someone trip, such as rocks or tools. Regularly check to see if handrails are loose or broken. Make sure that both sides of any steps have handrails. Any raised decks and porches should have guardrails on the edges. Have any leaves, snow, or ice cleared regularly. Use sand or salt on walking paths during winter. Clean up any spills in your garage right away. This includes oil or grease spills. What can I do in the bathroom? Use night lights. Install grab bars by the toilet and in the tub and shower. Do not use towel bars as  grab bars. Use non-skid mats or decals in the tub or shower. If you need to sit down in the shower, use a plastic, non-slip stool. Keep the floor dry. Clean up any water that spills on the floor as soon as it happens. Remove soap buildup in the tub or shower regularly. Attach bath mats securely with double-sided non-slip rug tape. Do not have throw rugs and other things on the floor that can make you trip. What can I do in the bedroom? Use night lights. Make sure that you have a light by your bed that is easy to reach. Do not use any sheets or blankets that are too big for your bed. They should not hang down onto the floor. Have a firm chair that has side arms. You can use this for support while you get dressed. Do not have throw rugs and other things on the floor that can make you trip. What can I do in the kitchen? Clean up any spills right away. Avoid walking on wet floors. Keep items that you use a lot in easy-to-reach places. If you need to reach something above you, use a strong step stool that has a  grab bar. Keep electrical cords out of the way. Do not use floor polish or wax that makes floors slippery. If you must use wax, use non-skid floor wax. Do not have throw rugs and other things on the floor that can make you trip. What can I do with my stairs? Do not leave any items on the stairs. Make sure that there are handrails on both sides of the stairs and use them. Fix handrails that are broken or loose. Make sure that handrails are as long as the stairways. Check any carpeting to make sure that it is firmly attached to the stairs. Fix any carpet that is loose or worn. Avoid having throw rugs at the top or bottom of the stairs. If you do have throw rugs, attach them to the floor with carpet tape. Make sure that you have a light switch at the top of the stairs and the bottom of the stairs. If you do not have them, ask someone to add them for you. What else can I do to help prevent  falls? Wear shoes that: Do not have high heels. Have rubber bottoms. Are comfortable and fit you well. Are closed at the toe. Do not wear sandals. If you use a stepladder: Make sure that it is fully opened. Do not climb a closed stepladder. Make sure that both sides of the stepladder are locked into place. Ask someone to hold it for you, if possible. Clearly mark and make sure that you can see: Any grab bars or handrails. First and last steps. Where the edge of each step is. Use tools that help you move around (mobility aids) if they are needed. These include: Canes. Walkers. Scooters. Crutches. Turn on the lights when you go into a dark area. Replace any light bulbs as soon as they burn out. Set up your furniture so you have a clear path. Avoid moving your furniture around. If any of your floors are uneven, fix them. If there are any pets around you, be aware of where they are. Review your medicines with your doctor. Some medicines can make you feel dizzy. This can increase your chance of falling. Ask your doctor what other things that you can do to help prevent falls. This information is not intended to replace advice given to you by your health care provider. Make sure you discuss any questions you have with your health care provider. Document Released: 11/04/2008 Document Revised: 06/16/2015 Document Reviewed: 02/12/2014 Elsevier Interactive Patient Education  2017 [employer].

## 2022-01-09 NOTE — Progress Notes (Signed)
I connected with Carolyn Morton today by telephone and verified that I am speaking with the correct person using two identifiers. Location patient: home Location provider: work Persons participating in the virtual visit: Donnald Garre LPN.   I discussed the limitations, risks, security and privacy concerns of performing an evaluation and management service by telephone and the availability of in person appointments. I also discussed with the patient that there may be a patient responsible charge related to this service. The patient expressed understanding and verbally consented to this telephonic visit.    Interactive audio and video telecommunications were attempted between this provider and patient, however failed, due to patient having technical difficulties OR patient did not have access to video capability.  We continued and completed visit with audio only.     Vital signs may be patient reported or missing.  Subjective:   Carolyn Morton is a 71 y.o. female who presents for an Initial Medicare Annual Wellness Visit.  Review of Systems     Cardiac Risk Factors include: advanced age (>52men, >98 women);hypertension;obesity (BMI >30kg/m2)     Objective:    Today's Vitals   01/09/22 1613 01/09/22 1616  BP: (!) 155/77   Pulse: 69   SpO2: 99%   Weight: 210 lb (95.3 kg)   Height: 5' (1.524 m)   PainSc:  3    Body mass index is 41.01 kg/m.     01/09/2022    4:23 PM 07/13/2019    8:58 AM  Advanced Directives  Does Patient Have a Medical Advance Directive? Yes No  Type of Estate agent of [place];Living will   Copy of Healthcare Power of Attorney in Chart? No - copy requested     Current Medications (verified) Outpatient Encounter Medications as of 01/09/2022  Medication Sig   acetaminophen (TYLENOL) 500 MG tablet Take 2 tablets (1,000 mg total) by mouth every 8 (eight) hours as needed.   albuterol (PROVENTIL) (2.5 MG/3ML) 0.083%  nebulizer solution USE 1 VIAL VIA NEBULIZER EVERY 6 HOURS AS NEEDED FOR WHEEZING OR SHORTNESS OF BREATH   albuterol (VENTOLIN HFA) 108 (90 Base) MCG/ACT inhaler Inhale 2 puffs into the lungs every 6 (six) hours as needed for wheezing or shortness of breath.   aspirin 81 MG chewable tablet Chew 81 mg by mouth as needed (for general health).   atorvastatin (LIPITOR) 40 MG tablet Take 1 tablet (40 mg total) by mouth daily.   cetirizine (ZYRTEC) 10 MG tablet Take 1 tablet (10 mg total) by mouth 2 (two) times daily as needed for allergies (During flare ups).   fluticasone (FLONASE) 50 MCG/ACT nasal spray Place 2 sprays into both nostrils daily.   fluticasone-salmeterol (ADVAIR HFA) 230-21 MCG/ACT inhaler Inhale 2 puffs into the lungs 2 (two) times daily.   ipratropium (ATROVENT) 0.03 % nasal spray Place 2 sprays into both nostrils 3 (three) times daily.   losartan-hydrochlorothiazide (HYZAAR) 100-25 MG tablet Take 1 tablet by mouth daily. NO Additional refills   omeprazole (PRILOSEC) 20 MG capsule Take 1 capsule (20 mg total) by mouth daily.   Tiotropium Bromide Monohydrate (SPIRIVA RESPIMAT) 2.5 MCG/ACT AERS Two puff daily   Tiotropium Bromide Monohydrate (SPIRIVA RESPIMAT) 2.5 MCG/ACT AERS Inhale 2 puffs into the lungs daily.   tiZANidine (ZANAFLEX) 4 MG tablet 1/2 - 1 tablet every 6 hours as needed for muscle spasm   DULoxetine (CYMBALTA) 60 MG capsule TAKE 1 CAPSULE(60 MG) BY MOUTH DAILY (Patient not taking: Reported on 01/09/2022)   predniSONE (DELTASONE)  10 MG tablet 4 tabs for 2 days, then 3 tabs for 2 days, 2 tabs for 2 days, then 1 tab for 2 days, then stop (Patient not taking: Reported on 01/09/2022)   No facility-administered encounter medications on file as of 01/09/2022.    Allergies (verified) Patient has no active allergies.   History: Past Medical History:  Diagnosis Date   COPD (chronic obstructive pulmonary disease) (HCC)    Hypertension    Sarcoidosis    Past Surgical  History:  Procedure Laterality Date   CHOLECYSTECTOMY N/A 07/19/2019   Procedure: LAPAROSCOPIC CHOLECYSTECTOMY WITH INTRAOPERATIVE CHOLANGIOGRAM;  Surgeon: Gaynelle AduWilson, Eric, MD;  [facility];  Service: General;  Laterality: N/A;   Family History  Family history unknown: Yes   Social History   Socioeconomic History   Marital status: Widowed    Spouse name: Not on file   Number of children: Not on file   Years of education: Not on file   Highest education level: Not on file  Occupational History   Not on file  Tobacco Use   Smoking status: Former    Types: Cigarettes    Quit date: 01/22/2010    Years since quitting: 11.9   Smokeless tobacco: Never  Vaping Use   Vaping Use: Former  Substance and Sexual Activity   Alcohol use: Not Currently   Drug use: Never   Sexual activity: Not on file  Other Topics Concern   Not on file  Social History Narrative   Not on file   Social Determinants of Health   Financial Resource Strain: Low Risk  (01/09/2022)   Overall Financial Resource Strain (CARDIA)    Difficulty of Paying Living Expenses: Not hard at all  Food Insecurity: No Food Insecurity (01/09/2022)   Hunger Vital Sign    Worried About Running Out of Food in the Last Year: Never true    Ran Out of Food in the Last Year: Never true  Transportation Needs: Unmet Transportation Needs (01/09/2022)   PRAPARE - Transportation    Lack of Transportation (Medical): Yes    Lack of Transportation (Non-Medical): Yes  Physical Activity: Inactive (01/09/2022)   Exercise Vital Sign    Days of Exercise per Week: 0 days    Minutes of Exercise per Session: 0 min  Stress: Stress Concern Present (01/09/2022)   [employer] - Occupational Stress Questionnaire    Feeling of Stress : To some extent  Social Connections: Not on file    Tobacco Counseling Counseling given: Not Answered   Clinical Intake:  Pre-visit preparation completed: Yes  Pain : 0-10 Pain  Score: 3  Pain Type: Chronic pain Pain Location: Back Pain Orientation: Lower Pain Descriptors / Indicators: Aching Pain Onset: More than a month ago Pain Frequency: Constant     Nutritional Status: BMI > 30  Obese Nutritional Risks: None Diabetes: No  How often do you need to have someone help you when you read instructions, pamphlets, or other written materials from your doctor or pharmacy?: 1 - Never  Diabetic? no  Interpreter Needed?: No  Information entered by :: NAllen LPN   Activities of Daily Living    01/09/2022    4:28 PM  In your present state of health, do you have any difficulty performing the following activities:  Hearing? 0  Vision? 0  Difficulty concentrating or making decisions? 0  Walking or climbing stairs? 1  Dressing or bathing? 0  Doing errands, shopping? 0  Preparing Food and eating ?  N  Using the Toilet? N  In the past six months, have you accidently leaked urine? Y  Do you have problems with loss of bowel control? N  Managing your Medications? N  Managing your Finances? N  Housekeeping or managing your Housekeeping? N    Patient Care Team: Claiborne Rigg, NP as PCP - General (Nurse Practitioner)  Indicate any recent Medical Services you may have received from other than [facility] providers in the past year (date may be approximate).     Assessment:   This is a routine wellness examination for Carolyn.  Hearing/Vision screen Vision Screening - Comments:: Regular eye exams,   Dietary issues and exercise activities discussed: Current Exercise Habits: The patient does not participate in regular exercise at present   Goals Addressed             This Visit's Progress    Patient Stated       01/09/2022, wants to get a little stronger and get more mobility       Depression Screen    01/09/2022    4:25 PM 05/10/2020   10:29 AM 11/16/2019    8:37 AM 10/20/2019   10:20 AM 07/13/2019    8:59 AM 05/13/2019    9:15 AM 04/07/2019    10:11 AM  PHQ 2/9 Scores  PHQ - 2 Score 5 3 0 2 2 2 1   PHQ- 9 Score 9 13 2 5 4 6 1     Fall Risk    01/09/2022    4:25 PM 05/10/2020   10:29 AM 11/16/2019    8:52 AM 07/13/2019    8:59 AM  Fall Risk   Falls in the past year? 0 0 0 0  Number falls in past yr: 0 0 0   Injury with Fall? 0 0 0   Risk for fall due to : Medication side effect     Follow up Falls prevention discussed;Education provided;Falls evaluation completed       FALL RISK PREVENTION PERTAINING TO THE HOME:  Any stairs in or around the home? No  If so, are there any without handrails? N/a Home free of loose throw rugs in walkways, pet beds, electrical cords, etc? Yes  Adequate lighting in your home to reduce risk of falls? Yes   ASSISTIVE DEVICES UTILIZED TO PREVENT FALLS:  Life alert? Yes  Use of a cane, walker or w/c? Yes  Grab bars in the bathroom? Yes  Shower chair or bench in shower? No  Elevated toilet seat or a handicapped toilet? No   TIMED UP AND GO:  Was the test performed? No .      Cognitive Function:        01/09/2022    4:29 PM  6CIT Screen  What Year? 0 points  What month? 0 points  What time? 3 points  Count back from 20 0 points  Months in reverse 0 points  Repeat phrase 2 points  Total Score 5 points    Immunizations Immunization History  Administered Date(s) Administered   PFIZER(Purple Top)SARS-COV-2 Vaccination 05/14/2019, 06/04/2019   Pneumococcal Conjugate-13 07/13/2019   Tdap 07/13/2019    TDAP status: Up to date  Flu Vaccine status: Declined, Education has been provided regarding the importance of this vaccine but patient still declined. Advised may receive this vaccine at local pharmacy or Health Dept. Aware to provide a copy of the vaccination record if obtained from local pharmacy or Health Dept. Verbalized acceptance and understanding.  Pneumococcal vaccine status:  Declined,  Education has been provided regarding the importance of this vaccine but patient  still declined. Advised may receive this vaccine at local pharmacy or Health Dept. Aware to provide a copy of the vaccination record if obtained from local pharmacy or Health Dept. Verbalized acceptance and understanding.   Covid-19 vaccine status: Completed vaccines  Qualifies for Shingles Vaccine? Yes   Zostavax completed No   Shingrix Completed?: No.    Education has been provided regarding the importance of this vaccine. Patient has been advised to call insurance company to determine out of pocket expense if they have not yet received this vaccine. Advised may also receive vaccine at local pharmacy or Health Dept. Verbalized acceptance and understanding.  Screening Tests Health Maintenance  Topic Date Due   Medicare Annual Wellness (AWV)  Never done   Zoster Vaccines- Shingrix (1 of 2) Never done   COVID-19 Vaccine (3 - 2023-24 season) 09/22/2021   Pneumonia Vaccine 57+ Years old (2 - PPSV23 or PCV20) 01/10/2023 (Originally 09/07/2019)   MAMMOGRAM  01/10/2023 (Originally 09/13/1999)   Fecal DNA (Cologuard)  01/10/2023 (Originally 09/13/1994)   DTaP/Tdap/Td (2 - Td or Tdap) 07/12/2029   HPV VACCINES  Aged Out   INFLUENZA VACCINE  Discontinued   DEXA SCAN  Discontinued   Hepatitis C Screening  Discontinued    Health Maintenance  Health Maintenance Due  Topic Date Due   Medicare Annual Wellness (AWV)  Never done   Zoster Vaccines- Shingrix (1 of 2) Never done   COVID-19 Vaccine (3 - 2023-24 season) 09/22/2021    Colorectal cancer screening: decline  Mammogram status: decline at this time  Bone Density status: n/a  Lung Cancer Screening: (Low Dose CT Chest recommended if Age 86-80 years, 30 pack-year currently smoking OR have quit w/in 15years.) does not qualify.   Lung Cancer Screening Referral: no  Additional Screening:  Hepatitis C Screening: does qualify;   Vision Screening: Recommended annual ophthalmology exams for early detection of glaucoma and other disorders of  the eye. Is the patient up to date with their annual eye exam?  Yes  Who is the provider or what is the name of the office in which the patient attends annual eye exams? Can't remember name If pt is not established with a provider, would they like to be referred to a provider to establish care? No .   Dental Screening: Recommended annual dental exams for proper oral hygiene  Community Resource Referral / Chronic Care Management: CRR required this visit?  No   CCM required this visit?  No      Plan:     I have personally reviewed and noted the following in the patient's chart:   Medical and social history Use of alcohol, tobacco or illicit drugs  Current medications and supplements including opioid prescriptions. Patient is not currently taking opioid prescriptions. Functional ability and status Nutritional status Physical activity Advanced directives List of other physicians Hospitalizations, surgeries, and ER visits in previous 12 months Vitals Screenings to include cognitive, depression, and falls Referrals and appointments  In addition, I have reviewed and discussed with patient certain preventive protocols, quality metrics, and [place] practice recommendations. A written personalized care plan for preventive services as well as general preventive health recommendations were provided to patient.     Barb Merino, LPN   24/40/1027   Nurse Notes: none  Due to this being a virtual visit, the after visit summary with patients personalized plan was offered to patient via mail or  my-chart. Patient would like to access on my-chart

## 2022-01-18 ENCOUNTER — Telehealth: Payer: Self-pay | Admitting: *Deleted

## 2022-01-18 NOTE — Progress Notes (Signed)
  Care Coordination  Outreach Note  01/18/2022 Name: Jerrie Schussler MRN: [id number] DOB: 1950-12-26   Care Coordination Outreach Attempts: An unsuccessful telephone outreach was attempted today to offer the patient information about available care coordination services as a benefit of their health plan.   Follow Up Plan:  Additional outreach attempts will be made to offer the patient care coordination information and services.   Encounter Outcome:  No Answer  Christie [place]  Care Coordination Care Guide  Direct Dial: [phone]

## 2022-01-23 ENCOUNTER — Encounter: Payer: Self-pay | Admitting: Pulmonary Disease

## 2022-01-24 ENCOUNTER — Other Ambulatory Visit: Payer: Self-pay

## 2022-01-24 ENCOUNTER — Other Ambulatory Visit: Payer: Self-pay | Admitting: Critical Care Medicine

## 2022-01-24 MED ORDER — ALBUTEROL SULFATE HFA 108 (90 BASE) MCG/ACT IN AERS: 2.0000 | INHALATION_SPRAY | Freq: Four times a day (QID) | RESPIRATORY_TRACT | 3 refills | Status: DC | PRN

## 2022-01-24 NOTE — Progress Notes (Unsigned)
  Care Coordination  Outreach Note  01/24/2022 Name: Carolynn Tuley MRN: [id number] DOB: 1950-12-06   Care Coordination Outreach Attempts: A second unsuccessful outreach was attempted today to offer the patient with information about available care coordination services as a benefit of their health plan.     Follow Up Plan:  Additional outreach attempts will be made to offer the patient care coordination information and services.   Encounter Outcome:  No Answer  [place]  Direct Dial: [phone]

## 2022-01-25 NOTE — Progress Notes (Signed)
  Care Coordination  Outreach Note  01/25/2022 Name: Carolyn Morton MRN: [id number] DOB: 1950-04-19   Care Coordination Outreach Attempts: A third unsuccessful outreach was attempted today to offer the patient with information about available care coordination services as a benefit of their health plan.   Follow Up Plan:  No further outreach attempts will be made at this time. We have been unable to contact the patient to offer or enroll patient in care coordination services  Encounter Outcome:  No Answer  [place]: [phone]

## 2022-02-16 ENCOUNTER — Other Ambulatory Visit: Payer: Self-pay | Admitting: *Deleted

## 2022-02-16 DIAGNOSIS — Z122 Encounter for screening for malignant neoplasm of respiratory organs: Secondary | ICD-10-CM

## 2022-02-16 DIAGNOSIS — Z87891 Personal history of nicotine dependence: Secondary | ICD-10-CM

## 2022-03-11 ENCOUNTER — Other Ambulatory Visit: Payer: Self-pay | Admitting: Family Medicine

## 2022-03-23 DIAGNOSIS — R0902 Hypoxemia: Secondary | ICD-10-CM | POA: Diagnosis not present

## 2022-03-28 ENCOUNTER — Ambulatory Visit (INDEPENDENT_AMBULATORY_CARE_PROVIDER_SITE_OTHER): Payer: Medicare Other | Admitting: Primary Care

## 2022-03-28 DIAGNOSIS — Z87891 Personal history of nicotine dependence: Secondary | ICD-10-CM | POA: Diagnosis not present

## 2022-03-28 NOTE — Patient Instructions (Signed)
Thank you for participating in the [facility]. It was our pleasure to meet you today. We will call you with the results of your scan within the next few days. Your scan will be assigned a Lung RADS category score by the physicians reading the scans.  This Lung RADS score determines follow up scanning.  See below for description of categories, and follow up screening recommendations. We will be in touch to schedule your follow up screening annually or based on recommendations of our providers. We will fax a copy of your scan results to your Primary Care Physician, or the physician who referred you to the program, to ensure they have the results. Please call the office if you have any questions or concerns regarding your scanning experience or results.  Our office number is [phone]. Please speak with Denise Phelps, RN. , or  Denise Buckner RN, They are  our Lung Cancer Screening RN.'s If They are unavailable when you call, Please leave a message on the voice mail. We will return your call at our earliest convenience.This voice mail is monitored several times a day.  Remember, if your scan is normal, we will scan you annually as long as you continue to meet the criteria for the program. (Age 50-80, Current smoker or smoker who has quit within the last 15 years). If you are a smoker, remember, quitting is the single most powerful action that you can take to decrease your risk of lung cancer and other pulmonary, breathing related problems. We know quitting is hard, and we are here to help.  Please let us know if there is anything we can do to help you meet your goal of quitting. If you are a former smoker, congratulations. We are proud of you! Remain smoke free! Remember you can refer friends or family members through the number above.  We will screen them to make sure they meet criteria for the program. Thank you for helping us take better care of you by  participating in Lung Screening.  You can receive free nicotine replacement therapy ( patches, gum or mints) by calling 1-800-QUIT NOW. Please call so we can get you on the path to becoming  a non-smoker. I know it is hard, but you can do this!  Lung RADS Categories:  Lung RADS 1: no nodules or definitely non-concerning nodules.  Recommendation is for a repeat annual scan in 12 months.  Lung RADS 2:  nodules that are non-concerning in appearance and behavior with a very low likelihood of becoming an active cancer. Recommendation is for a repeat annual scan in 12 months.  Lung RADS 3: nodules that are probably non-concerning , includes nodules with a low likelihood of becoming an active cancer.  Recommendation is for a 6-month repeat screening scan. Often noted after an upper respiratory illness. We will be in touch to make sure you have no questions, and to schedule your 6-month scan.  Lung RADS 4 A: nodules with concerning findings, recommendation is most often for a follow up scan in 3 months or additional testing based on our provider's assessment of the scan. We will be in touch to make sure you have no questions and to schedule the recommended 3 month follow up scan.  Lung RADS 4 B:  indicates findings that are concerning. We will be in touch with you to schedule additional diagnostic testing based on our provider's  assessment of the scan.  Other options for assistance in smoking cessation (   As covered by your insurance benefits)  Hypnosis for smoking cessation  [employer] [phone]  Acupuncture for smoking cessation  [facility] [phone]   

## 2022-03-28 NOTE — Progress Notes (Signed)
Virtual Visit via Telephone Note  I connected with Sparks on 03/28/22 at  1:30 PM EST by telephone and verified that I am speaking with the correct person using two identifiers.  Location: Patient: Home Provider: Office   I discussed the limitations, risks, security and privacy concerns of performing an evaluation and management service by telephone and the availability of in person appointments. I also discussed with the patient that there may be a patient responsible charge related to this service. The patient expressed understanding and agreed to proceed.   Shared Decision Making Visit Lung Cancer Screening Program [phone])   Eligibility: Age 72 y.o. Pack Years Smoking History Calculation 70 (# packs/per year x # years smoked) Recent History of coughing up blood  no Unexplained weight loss? no ( >Than 15 pounds within the last 6 months ) Prior History Lung / other cancer no (Diagnosis within the last 5 years already requiring surveillance chest CT Scans). Smoking Status Former Smoker Former Smokers: Years since quit: 12 years  Quit Date: 2012  Visit Components: Discussion included one or more decision making aids. yes Discussion included risk/benefits of screening. yes Discussion included potential follow up diagnostic testing for abnormal scans. yes Discussion included meaning and risk of over diagnosis. yes Discussion included meaning and risk of False Positives. yes Discussion included meaning of total radiation exposure. yes  Counseling Included: Importance of adherence to annual lung cancer LDCT screening. yes Impact of comorbidities on ability to participate in the program. yes Ability and willingness to under diagnostic treatment. yes  Smoking Cessation Counseling: Current Smokers:  Discussed importance of smoking cessation. yes Information about tobacco cessation classes and interventions provided to patient. yes Patient provided with "ticket" for LDCT  Scan. NA Symptomatic Patient. no  Counseling(Intermediate counseling: > three minutes) 99406 Diagnosis Code: Tobacco Use Z72.0 Asymptomatic Patient yes  Counseling (Intermediate counseling: > three minutes counseling) [id number] Former Smokers:  Discussed the importance of maintaining cigarette abstinence. yes Diagnosis Code: Personal History of Nicotine Dependence. [id number] Information about tobacco cessation classes and interventions provided to patient. Yes Patient provided with "ticket" for LDCT Scan. NA Written Order for Lung Cancer Screening with LDCT placed in Epic. Yes (CT Chest Lung Cancer Screening Low Dose W/O CM) [id number] Z12.2-Screening of respiratory organs Z87.891-Personal history of nicotine dependence  I have spent 25 minutes of face to face/ virtual visit   time with Ms Cokley discussing the risks and benefits of lung cancer screening. We viewed / discussed a power point together that explained in detail the above noted topics. We paused at intervals to allow for questions to be asked and answered to ensure understanding.We discussed that the single most powerful action that she can take to decrease her risk of developing lung cancer is to quit smoking. We discussed whether or not she is ready to commit to setting a quit date. We discussed options for tools to aid in quitting smoking including nicotine replacement therapy, non-nicotine medications, support groups, Quit Smart classes, and behavior modification. We discussed that often times setting smaller, more achievable goals, such as eliminating 1 cigarette a day for a week and then 2 cigarettes a day for a week can be helpful in slowly decreasing the number of cigarettes smoked. This allows for a sense of accomplishment as well as providing a clinical benefit. I provided  her  with smoking cessation  information  with contact information for community resources, classes, free nicotine replacement therapy, and access to mobile apps, text  messaging, and  on-line smoking cessation help. I have also provided  her  the office contact information in the event she needs to contact me, or the screening staff. We discussed the time and location of the scan, and that either Doroteo Glassman RN, Joella Prince, RN  or I will call / send a letter with the results within 24-72 hours of receiving them. The patient verbalized understanding of all of  the above and had no further questions upon leaving the office. They have my contact information in the event they have any further questions.  I spent 3-5 minutes counseling on smoking cessation and the health risks of continued tobacco abuse.  I explained to the patient that there has been a high incidence of coronary artery disease noted on these exams. I explained that this is a non-gated exam therefore degree or severity cannot be determined. This patient is NOT on statin therapy. I have asked the patient to follow-up with their PCP regarding any incidental finding of coronary artery disease and management with diet or medication as their PCP  feels is clinically indicated. The patient verbalized understanding of the above and had no further questions upon completion of the visit.     Martyn Ehrich, NP

## 2022-03-29 ENCOUNTER — Other Ambulatory Visit: Payer: Medicare Other

## 2022-04-02 ENCOUNTER — Telehealth: Payer: Self-pay | Admitting: Pulmonary Disease

## 2022-04-02 ENCOUNTER — Inpatient Hospital Stay: Admission: RE | Admit: 2022-04-02 | Payer: Medicare Other | Source: Ambulatory Visit

## 2022-04-02 NOTE — Telephone Encounter (Signed)
PT calling. Has not not her new ins cards yet.  She will call us to sched the CT.

## 2022-04-03 ENCOUNTER — Other Ambulatory Visit: Payer: Medicare Other

## 2022-04-03 NOTE — Telephone Encounter (Signed)
I have documented this in the ct

## 2022-04-14 DIAGNOSIS — R0902 Hypoxemia: Secondary | ICD-10-CM | POA: Diagnosis not present

## 2022-04-23 DIAGNOSIS — R0902 Hypoxemia: Secondary | ICD-10-CM | POA: Diagnosis not present

## 2022-04-24 NOTE — Telephone Encounter (Signed)
Called and spoke w/ pt she verbalized that she is referring to the form for [employer] to turn her power on as a priority if it goes out. Instructed pt to contact [employer] for that form and to bring it in office for [place] to sign. NFN att.

## 2022-04-30 NOTE — Telephone Encounter (Signed)
Verified with pt she still has [facility] Medicare and id # is the same.  Rescheduled CT to be done at [facility]  Spoke to pt & gave her new appt info.  Nothing further needed for CT.  Will route back to triage to close MyChart message if nothing further needed.

## 2022-05-08 ENCOUNTER — Ambulatory Visit: Payer: Medicare Other | Admitting: Nurse Practitioner

## 2022-05-11 ENCOUNTER — Ambulatory Visit (HOSPITAL_BASED_OUTPATIENT_CLINIC_OR_DEPARTMENT_OTHER): Payer: Medicare Other

## 2022-05-15 DIAGNOSIS — R0902 Hypoxemia: Secondary | ICD-10-CM | POA: Diagnosis not present

## 2022-05-23 DIAGNOSIS — R0902 Hypoxemia: Secondary | ICD-10-CM | POA: Diagnosis not present

## 2022-06-14 DIAGNOSIS — R0902 Hypoxemia: Secondary | ICD-10-CM | POA: Diagnosis not present

## 2022-06-15 ENCOUNTER — Ambulatory Visit (HOSPITAL_BASED_OUTPATIENT_CLINIC_OR_DEPARTMENT_OTHER)
Admission: RE | Admit: 2022-06-15 | Discharge: 2022-06-15 | Disposition: A | Discharge status: Home / Self Care | Payer: Medicare Other | Source: Ambulatory Visit | Attending: Acute Care | Admitting: Acute Care

## 2022-06-15 DIAGNOSIS — Z122 Encounter for screening for malignant neoplasm of respiratory organs: Secondary | ICD-10-CM | POA: Diagnosis not present

## 2022-06-15 DIAGNOSIS — Z87891 Personal history of nicotine dependence: Secondary | ICD-10-CM | POA: Diagnosis not present

## 2022-06-18 ENCOUNTER — Encounter: Payer: Self-pay | Admitting: Nurse Practitioner

## 2022-06-19 ENCOUNTER — Telehealth: Payer: Self-pay

## 2022-06-19 ENCOUNTER — Other Ambulatory Visit: Payer: Self-pay

## 2022-06-19 NOTE — Progress Notes (Unsigned)
Patient attempted to be outreached by Hatsuko Bizzarro Messa, PharmD Candidate on 06/19/22 to discuss hypertension. Left voicemail for patient to return our call at their convenience at [phone].  Valerio Pinard, Student-PharmD 

## 2022-06-19 NOTE — Progress Notes (Signed)
   Carolyn Morton 1950-08-07 [id number]   Patient expressed concerns regarding making it to her appointment tomorrow. Her car has not been working for a while and it would be $60 to uber there and back which she is not sure she can afford to do.   Patient outreached by Seward Meth , PharmD Candidate on 06/19/22.  Blood Pressure Readings: Last documented ambulatory systolic blood pressure: 155 Last documented ambulatory diastolic blood pressure: 77 Does the patient have a validated home blood pressure machine?: Yes (Taking her BP every other day, 138/74) They report home readings 138/74 - last reading   Medication review was performed. Is the patient taking their medications as prescribed?: Yes Differences from their prescribed list include: She was out of town as her son passed and did not have enough BP medication. She has not been taking her BP meds for a few months but she has been monitoring her BP and she states it has been doing well and would like to get back on her medication.  The following barriers to adherence were noted: Does the patient have cost concerns?: Yes (States her funds have been limited) Does the patient have transportation concerns?: Yes (States her car is disabled and needs transportation to her appointment) Does the patient need assistance obtaining refills?: No Does the patient occassionally forget to take some of their prescribed medications?: No Does the patient feel like one/some of their medications make them feel poorly?: No Does the patient have questions or concerns about their medications?: No Does the patient have a follow up scheduled with their primary care provider/cardiologist?: Yes   Interventions: Interventions Completed: Medications were reviewed, Patient was educated on goal blood pressures and long term health implications of elevated blood pressure, Patient was educated on proper technique to check home blood pressure and reminded to bring home  machine and readings to next provider appointment  The patient has follow up scheduled:  PCP: Claiborne Rigg, NP   Seward Meth, Student-PharmD

## 2022-06-20 ENCOUNTER — Encounter: Payer: Self-pay | Admitting: Nurse Practitioner

## 2022-06-20 ENCOUNTER — Ambulatory Visit: Payer: Medicare Other | Attending: Nurse Practitioner | Admitting: Nurse Practitioner

## 2022-06-20 VITALS — BP 152/100 | HR 102 | Ht 60.0 in | Wt 209.6 lb

## 2022-06-20 DIAGNOSIS — E1165 Type 2 diabetes mellitus with hyperglycemia: Secondary | ICD-10-CM

## 2022-06-20 DIAGNOSIS — R7989 Other specified abnormal findings of blood chemistry: Secondary | ICD-10-CM | POA: Diagnosis not present

## 2022-06-20 DIAGNOSIS — Z1231 Encounter for screening mammogram for malignant neoplasm of breast: Secondary | ICD-10-CM

## 2022-06-20 DIAGNOSIS — Z7984 Long term (current) use of oral hypoglycemic drugs: Secondary | ICD-10-CM | POA: Diagnosis not present

## 2022-06-20 DIAGNOSIS — E785 Hyperlipidemia, unspecified: Secondary | ICD-10-CM | POA: Diagnosis not present

## 2022-06-20 DIAGNOSIS — Z13820 Encounter for screening for osteoporosis: Secondary | ICD-10-CM | POA: Diagnosis not present

## 2022-06-20 DIAGNOSIS — Z780 Asymptomatic menopausal state: Secondary | ICD-10-CM

## 2022-06-20 DIAGNOSIS — R000 Tachycardia, unspecified: Secondary | ICD-10-CM | POA: Diagnosis not present

## 2022-06-20 DIAGNOSIS — F419 Anxiety disorder, unspecified: Secondary | ICD-10-CM

## 2022-06-20 DIAGNOSIS — I10 Essential (primary) hypertension: Secondary | ICD-10-CM | POA: Diagnosis not present

## 2022-06-20 DIAGNOSIS — K219 Gastro-esophageal reflux disease without esophagitis: Secondary | ICD-10-CM | POA: Diagnosis not present

## 2022-06-20 MED ORDER — BUSPIRONE HCL 10 MG PO TABS: 5.0000 mg | ORAL_TABLET | Freq: Two times a day (BID) | ORAL | 1 refills | Status: AC | PRN

## 2022-06-20 MED ORDER — LOSARTAN POTASSIUM-HCTZ 100-25 MG PO TABS: 1.0000 | ORAL_TABLET | Freq: Every day | ORAL | 1 refills | Status: AC

## 2022-06-20 MED ORDER — OMEPRAZOLE 20 MG PO CPDR: 20.0000 mg | DELAYED_RELEASE_CAPSULE | Freq: Every day | ORAL | 1 refills | Status: AC

## 2022-06-20 NOTE — Progress Notes (Signed)
Assessment & Plan:  [name] was seen today for hypertension.  Diagnoses and all orders for this visit:  Essential hypertension -     CMP14+EGFR -     losartan-hydrochlorothiazide (HYZAAR) 100-25 MG tablet; Take 1 tablet by mouth daily. Continue all antihypertensives as prescribed.  Reminded to bring in blood pressure log for follow  up appointment.  RECOMMENDATIONS: DASH/Mediterranean Diets are healthier choices for HTN.    Type 2 diabetes mellitus with hyperglycemia, without long-term current use of insulin (HCC) -     Hemoglobin A1c -     CMP14+EGFR -     Microalbumin / creatinine urine ratio Continue blood sugar control as discussed in office today, low carbohydrate diet, and regular physical exercise as tolerated, 150 minutes per week (30 min each day, 5 days per week, or 50 min 3 days per week). Keep blood sugar logs with fasting goal of 90-130 mg/dl, post prandial (after you eat) less than 180.  For Hypoglycemia: BS <60 and Hyperglycemia BS >400; contact the clinic ASAP. Annual eye exams and foot exams are recommended.   Dyslipidemia, goal LDL below 70 Not taking atorvastatin -     Lipid panel INSTRUCTIONS: Work on a low fat, heart healthy diet and participate in regular aerobic exercise program by working out at least 150 minutes per week; 5 days a week-30 minutes per day. Avoid red meat/beef/steak,  fried foods. junk foods, sodas, sugary drinks, unhealthy snacking, alcohol and smoking.  Drink at least 80 oz of water per day and monitor your carbohydrate intake daily.    Abnormal CBC -     CBC with Differential  Anxiety -     busPIRone (BUSPAR) 10 MG tablet; Take 0.5-1 tablets (5-10 mg total) by mouth 2 (two) times daily as needed. For anxiety -     Ambulatory referral to Psychiatry  Encounter for osteoporosis screening in asymptomatic postmenopausal patient -     DG Bone Density; Future  Breast cancer screening by mammogram -     MM 3D SCREENING MAMMOGRAM BILATERAL  BREAST; Future  Tachycardia -     Ambulatory referral to Cardiology  GERD without esophagitis Well controlled -     omeprazole (PRILOSEC) 20 MG capsule; Take 1 capsule (20 mg total) by mouth daily. INSTRUCTIONS: Avoid GERD Triggers: acidic, spicy or fried foods, caffeine, coffee, sodas,  alcohol and chocolate.      Patient has been counseled on age-appropriate routine health concerns for screening and prevention. These are reviewed and up-to-date. Referrals have been placed accordingly. Immunizations are up-to-date or declined.    Subjective:   Chief Complaint  Patient presents with   Hypertension   HPI Carolyn Morton 72 y.o. female presents to office today for follow up to HTN. I have not seen her in the office in a few years.    She has a past medical history of COPD (chronic obstructive pulmonary disease) (HCC), Hypertension, and Sarcoidosis (on chronic O2. Followed by Pulmonology).    DM 2 Well controlled at this time. She is not taking any medications for diabetes at this time.  Lab Results  Component Value Date   HGBA1C 6.7 (H) 05/09/2020    Anxiety and Depression Still grieving the death of her son. Also notes deaths of her mother and husband continue to affect her as well. She was previously seeing a therapist however she alternates residences between [place] and [place] and her previous therapist did not have licensure in [place]. She is interested in taking an as  needed medication for anxiety at this time. Also will refer for psychotherapy today.     06/20/2022    3:01 PM 01/09/2022    4:25 PM 05/10/2020   10:29 AM  Depression screen PHQ 2/9  Decreased Interest 1 2 1   Down, Depressed, Hopeless 3 3 2   PHQ - 2 Score 4 5 3   Altered sleeping 2 3 2   Tired, decreased energy 2 1 2   Change in appetite 1 0 2  Feeling bad or failure about yourself  2 0 2  Trouble concentrating 2 0 1  Moving slowly or fidgety/restless 3 0 1  Suicidal thoughts 2 0 0  PHQ-9 Score 18 9 13   Difficult doing  work/chores  Somewhat difficult        06/20/2022    3:01 PM 05/10/2020   10:29 AM 11/16/2019    8:38 AM 10/20/2019   10:20 AM  GAD 7 : Generalized Anxiety Score  Nervous, Anxious, on Edge 3 3 0 2  Control/stop worrying 3 2 1 1   Worry too much - different things 3 2 0 1  Trouble relaxing 2 2 0 1  Restless 2 1 0 0  Easily annoyed or irritable 2 2 0 0  Afraid - awful might happen 3 2 0 2  Total GAD 7 Score 18 14 1 7       HTN Blood pressure is elevated. She has been out of her hyzaar 100-25 mg for quite some time. BP Readings from Last 3 Encounters:  06/20/22 (!) 152/100  06/19/22 138/74  01/09/22 (!) 155/77     Review of Systems  Constitutional:  Negative for fever, malaise/fatigue and weight loss.  HENT: Negative.  Negative for nosebleeds.   Eyes: Negative.  Negative for blurred vision, double vision and photophobia.  Respiratory: Negative.  Negative for cough and shortness of breath.   Cardiovascular: Negative.  Negative for chest pain, palpitations and leg swelling.  Gastrointestinal: Negative.  Negative for heartburn, nausea and vomiting.  Musculoskeletal: Negative.  Negative for myalgias.  Neurological: Negative.  Negative for dizziness, focal weakness, seizures and headaches.  Psychiatric/Behavioral:  Positive for depression. Negative for suicidal ideas. The patient is nervous/anxious.     Past Medical History:  Diagnosis Date   COPD (chronic obstructive pulmonary disease) (HCC)    Hypertension    Sarcoidosis     Past Surgical History:  Procedure Laterality Date   CHOLECYSTECTOMY N/A 07/19/2019   Procedure: LAPAROSCOPIC CHOLECYSTECTOMY WITH INTRAOPERATIVE CHOLANGIOGRAM;  Surgeon: Gaynelle Adu, MD;  Location[facility] OR;  Service: General;  Laterality: N/A;    Family History  Family history unknown: Yes    Social History Reviewed with no changes to be made today.   Outpatient Medications Prior to Visit  Medication Sig Dispense Refill   acetaminophen (TYLENOL)  500 MG tablet Take 2 tablets (1,000 mg total) by mouth every 8 (eight) hours as needed. 30 tablet 0   albuterol (PROVENTIL) (2.5 MG/3ML) 0.083% nebulizer solution USE 1 VIAL VIA NEBULIZER EVERY 6 HOURS AS NEEDED FOR WHEEZING OR SHORTNESS OF BREATH 360 mL 3   albuterol (VENTOLIN HFA) 108 (90 Base) MCG/ACT inhaler Inhale 2 puffs into the lungs every 6 (six) hours as needed for wheezing or shortness of breath. 24 g 3   aspirin 81 MG chewable tablet Chew 81 mg by mouth as needed (for general health).     cetirizine (ZYRTEC) 10 MG tablet Take 1 tablet (10 mg total) by mouth 2 (two) times daily as needed for allergies (During  flare ups). 60 tablet 5   DULoxetine (CYMBALTA) 60 MG capsule TAKE 1 CAPSULE(60 MG) BY MOUTH DAILY 30 capsule 0   fluticasone (FLONASE) 50 MCG/ACT nasal spray Place 2 sprays into both nostrils daily. 16 g 6   fluticasone-salmeterol (ADVAIR HFA) 230-21 MCG/ACT inhaler INHALE 2 PUFFS INTO THE LUNGS TWICE DAILY 12 g 0   ipratropium (ATROVENT) 0.03 % nasal spray Place 2 sprays into both nostrils 3 (three) times daily. 30 mL 5   Tiotropium Bromide Monohydrate (SPIRIVA RESPIMAT) 2.5 MCG/ACT AERS Two puff daily 12 g 3   tiZANidine (ZANAFLEX) 4 MG tablet 1/2 - 1 tablet every 6 hours as needed for muscle spasm 30 tablet 2   atorvastatin (LIPITOR) 40 MG tablet Take 1 tablet (40 mg total) by mouth daily. (Patient not taking: Reported on 06/20/2022) 90 tablet 3   losartan-hydrochlorothiazide (HYZAAR) 100-25 MG tablet Take 1 tablet by mouth daily. NO Additional refills (Patient not taking: Reported on 06/19/2022) 30 tablet 0   omeprazole (PRILOSEC) 20 MG capsule Take 1 capsule (20 mg total) by mouth daily. (Patient not taking: Reported on 06/20/2022) 30 capsule 2   No facility-administered medications prior to visit.    No Active Allergies     Objective:    BP (!) 152/100   Pulse (!) 102   Ht 5' (1.524 m)   Wt 209 lb 9.6 oz (95.1 kg)   SpO2 95%   BMI 40.93 kg/m  Wt Readings from Last 3  Encounters:  06/20/22 209 lb 9.6 oz (95.1 kg)  01/09/22 210 lb (95.3 kg)  02/16/21 215 lb (97.5 kg)    Physical Exam Vitals and nursing note reviewed.  Constitutional:      Appearance: She is well-developed.  HENT:     Head: Normocephalic and atraumatic.  Cardiovascular:     Rate and Rhythm: Regular rhythm. Tachycardia present.     Heart sounds: Normal heart sounds. No murmur heard.    No friction rub. No gallop.  Pulmonary:     Effort: Pulmonary effort is normal. No tachypnea or respiratory distress.     Breath sounds: Normal breath sounds. No decreased breath sounds, wheezing, rhonchi or rales.  Chest:     Chest wall: No tenderness.  Abdominal:     General: Bowel sounds are normal.     Palpations: Abdomen is soft.  Musculoskeletal:        General: Normal range of motion.     Cervical back: Normal range of motion.  Skin:    General: Skin is warm and dry.  Neurological:     Mental Status: She is alert and oriented to person, place, and time.     Coordination: Coordination normal.  Psychiatric:        Behavior: Behavior normal. Behavior is cooperative.        Thought Content: Thought content normal.        Judgment: Judgment normal.          Patient has been counseled extensively about nutrition and exercise as well as the importance of adherence with medications and regular follow-up. The patient was given clear instructions to go to ER or return to medical center if symptoms don't improve, worsen or new problems develop. The patient verbalized understanding.   Follow-up: Return in about 4 weeks (around 07/18/2022) for virtual BP check.   Claiborne Rigg, FNP-BC [facility] [place], [place] [phone]   06/20/2022, 6:03 PM

## 2022-06-20 NOTE — Progress Notes (Signed)
Self diagnosis.

## 2022-06-21 LAB — LIPID PANEL
Chol/HDL Ratio: 3.7 ratio (ref 0.0–4.4)
Cholesterol, Total: 168 mg/dL (ref 100–199)
HDL: 45 mg/dL (ref >39)
LDL Chol Calc (NIH): 106 mg/dL — ABNORMAL HIGH (ref 0–99)
Triglycerides: 90 mg/dL (ref 0–149)
VLDL Cholesterol Cal: 17 mg/dL (ref 5–40)

## 2022-06-21 LAB — CMP14+EGFR
ALT: 9 IU/L (ref 0–32)
AST: 12 IU/L (ref 0–40)
Albumin/Globulin Ratio: 1.5 (ref 1.2–2.2)
Albumin: 4.3 g/dL (ref 3.8–4.8)
Alkaline Phosphatase: 73 IU/L (ref 44–121)
BUN/Creatinine Ratio: 21 (ref 12–28)
BUN: 15 mg/dL (ref 8–27)
Bilirubin Total: 0.4 mg/dL (ref 0.0–1.2)
CO2: 25 mmol/L (ref 20–29)
Calcium: 10.5 mg/dL — ABNORMAL HIGH (ref 8.7–10.3)
Chloride: 101 mmol/L (ref 96–106)
Creatinine, Ser: 0.72 mg/dL (ref 0.57–1.00)
Globulin, Total: 2.9 g/dL (ref 1.5–4.5)
Glucose: 121 mg/dL — ABNORMAL HIGH (ref 70–99)
Potassium: 4.5 mmol/L (ref 3.5–5.2)
Sodium: 141 mmol/L (ref 134–144)
Total Protein: 7.2 g/dL (ref 6.0–8.5)
eGFR: 89 mL/min/{1.73_m2} (ref >59)

## 2022-06-21 LAB — CBC WITH DIFFERENTIAL/PLATELET
Basophils Absolute: 0.1 10*3/uL (ref 0.0–0.2)
Basos: 1 %
EOS (ABSOLUTE): 0.2 10*3/uL (ref 0.0–0.4)
Eos: 2 %
Hematocrit: 42.1 % (ref 34.0–46.6)
Hemoglobin: 13.2 g/dL (ref 11.1–15.9)
Immature Grans (Abs): 0 10*3/uL (ref 0.0–0.1)
Immature Granulocytes: 0 %
Lymphocytes Absolute: 2.9 10*3/uL (ref 0.7–3.1)
Lymphs: 28 %
MCH: 26.7 pg (ref 26.6–33.0)
MCHC: 31.4 g/dL — ABNORMAL LOW (ref 31.5–35.7)
MCV: 85 fL (ref 79–97)
Monocytes Absolute: 0.6 10*3/uL (ref 0.1–0.9)
Monocytes: 6 %
Neutrophils Absolute: 6.5 10*3/uL (ref 1.4–7.0)
Neutrophils: 63 %
Platelets: 316 10*3/uL (ref 150–450)
RBC: 4.94 x10E6/uL (ref 3.77–5.28)
RDW: 12.8 % (ref 11.7–15.4)
WBC: 10.3 10*3/uL (ref 3.4–10.8)

## 2022-06-21 LAB — HEMOGLOBIN A1C
Est. average glucose Bld gHb Est-mCnc: 143 mg/dL
Hgb A1c MFr Bld: 6.6 % — ABNORMAL HIGH (ref 4.8–5.6)

## 2022-06-23 DIAGNOSIS — R0902 Hypoxemia: Secondary | ICD-10-CM | POA: Diagnosis not present

## 2022-06-25 ENCOUNTER — Other Ambulatory Visit: Payer: Self-pay

## 2022-06-25 DIAGNOSIS — Z122 Encounter for screening for malignant neoplasm of respiratory organs: Secondary | ICD-10-CM

## 2022-06-25 DIAGNOSIS — Z87891 Personal history of nicotine dependence: Secondary | ICD-10-CM

## 2022-06-28 ENCOUNTER — Encounter: Payer: Self-pay | Admitting: Nurse Practitioner

## 2022-07-09 ENCOUNTER — Encounter: Payer: Self-pay | Admitting: *Deleted

## 2022-07-09 NOTE — Progress Notes (Signed)
[facility] Quality Team Note  Name: Carolyn Morton Date of Birth: February 22, 1950 MRN: [id number] Date: 07/09/2022  [facility] Quality Team has reviewed this patient's chart, please see recommendations below:  [facility] Quality Other; Pt has open gaps for AWV (not due until Dec 2024), BCS (order on file 06/20/22) and Colon screening.  Would provider mind addressing on video visit 07/18/22?

## 2022-07-15 DIAGNOSIS — R0902 Hypoxemia: Secondary | ICD-10-CM | POA: Diagnosis not present

## 2022-07-18 ENCOUNTER — Telehealth: Payer: Medicare Other | Admitting: Nurse Practitioner

## 2022-07-23 DIAGNOSIS — R0902 Hypoxemia: Secondary | ICD-10-CM | POA: Diagnosis not present

## 2022-08-14 DIAGNOSIS — R0902 Hypoxemia: Secondary | ICD-10-CM | POA: Diagnosis not present

## 2022-08-23 DIAGNOSIS — R0902 Hypoxemia: Secondary | ICD-10-CM | POA: Diagnosis not present

## 2022-09-11 DIAGNOSIS — J441 Chronic obstructive pulmonary disease with (acute) exacerbation: Secondary | ICD-10-CM | POA: Diagnosis not present

## 2022-09-14 DIAGNOSIS — R0902 Hypoxemia: Secondary | ICD-10-CM | POA: Diagnosis not present

## 2022-09-20 ENCOUNTER — Telehealth: Payer: Self-pay | Admitting: Pharmacist

## 2022-09-20 NOTE — Telephone Encounter (Signed)
Called patient to schedule an appointment for BP check. I was unable to reach the patient so I left a HIPAA-compliant message requesting that the patient return my call.   Butch Penny, PharmD, Patsy Baltimore, CPP Clinical Pharmacist [facility] [phone]

## 2022-09-23 DIAGNOSIS — R0902 Hypoxemia: Secondary | ICD-10-CM | POA: Diagnosis not present

## 2022-10-15 DIAGNOSIS — R0902 Hypoxemia: Secondary | ICD-10-CM | POA: Diagnosis not present

## 2022-10-16 ENCOUNTER — Ambulatory Visit: Payer: Medicare Other | Attending: Nurse Practitioner

## 2022-10-16 VITALS — Ht 60.0 in | Wt 209.0 lb

## 2022-10-16 DIAGNOSIS — Z0000 Encounter for general adult medical examination without abnormal findings: Secondary | ICD-10-CM

## 2022-10-16 NOTE — Patient Instructions (Signed)
Carolyn Morton , Thank you for taking time to come for your Medicare Wellness Visit. I appreciate your ongoing commitment to your health goals. Please review the following plan we discussed and let me know if I can assist you in the future.   Referrals/Orders/Follow-Ups/Clinician Recommendations: Aim for 30 minutes of exercise or brisk walking, 6-8 glasses of water, and 5 servings of fruits and vegetables each day.  You have an order for:  []   2D Mammogram  [x]   3D Mammogram  []   Bone Density     Please call for appointment:   [facility] [address] [place], [place] [address] [phone]  Make sure to wear two-piece clothing.  No lotions, powders, or deodorants the day of the appointment. Make sure to bring picture ID and insurance card.  Bring list of medications you are currently taking including any supplements.   Schedule your [place] screening mammogram through MyChart!   Log into your MyChart account.  Go to 'Visit' (or 'Appointments' if on mobile App) --> Schedule an Appointment  Under 'Select a Reason for Visit' choose the Mammogram Screening option.  Complete the pre-visit questions and select the time and place that [place] fits your schedule.    This is a list of the screening recommended for you and due dates:  Health Maintenance  Topic Date Due   Yearly kidney health urinalysis for diabetes  Never done   Zoster (Shingles) Vaccine (1 of 2) Never done   COVID-19 Vaccine (3 - 2023-24 season) 09/23/2022   Pneumonia Vaccine (2 of 2 - PPSV23 or PCV20) 01/10/2023*   Mammogram  01/10/2023*   Cologuard (Stool DNA test)  01/10/2023*   Yearly kidney function blood test for diabetes  06/20/2023   Medicare Annual Wellness Visit  10/16/2023   DTaP/Tdap/Td vaccine (2 - Td or Tdap) 07/12/2029   HPV Vaccine  Aged Out   Flu Shot  Discontinued   DEXA scan (bone density measurement)  Discontinued   Hepatitis C Screening  Discontinued  *Topic was  postponed. The date shown is not the original due date.    Advanced directives: (ACP Link)Information on Advanced Care Planning can be found at [facility] [place] Advance Health Care Directives Advance Health Care Directives ([email or web address]   Next Medicare Annual Wellness Visit scheduled for next year: Yes  Insert Preventive Care attachment Insert FALL PREVENTION attachment if needed

## 2022-10-16 NOTE — Progress Notes (Signed)
Subjective:   Trachelle Morton is a 72 y.o. female who presents for Medicare Annual (Subsequent) preventive examination.  Visit Complete: Virtual  I connected with  [name] on 10/16/22 by a audio enabled telemedicine application and verified that I am speaking with the correct person using two identifiers.  Patient Location: Home  Provider Location: Home Office  I discussed the limitations of evaluation and management by telemedicine. The patient expressed understanding and agreed to proceed.  Because this visit was a virtual/telehealth visit, some criteria may be missing or patient reported. Any vitals not documented were not able to be obtained and vitals that have been documented are patient reported.   Cardiac Risk Factors include: advanced age (>67men, >53 women);hypertension     Objective:    Today's Vitals   10/16/22 1034  Weight: 209 lb (94.8 kg)  Height: 5' (1.524 m)   Body mass index is 40.82 kg/m.     10/16/2022    5:22 PM 01/09/2022    4:23 PM 07/13/2019    8:58 AM  Advanced Directives  Does Patient Have a Medical Advance Directive? No Yes No  Type of Special educational needs teacher of [place];Living will   Copy of Healthcare Power of Attorney in Chart?  No - copy requested   Would patient like information on creating a medical advance directive? Yes (MAU/Ambulatory/Procedural Areas - Information given)      Current Medications (verified) Outpatient Encounter Medications as of 10/16/2022  Medication Sig   acetaminophen (TYLENOL) 500 MG tablet Take 2 tablets (1,000 mg total) by mouth every 8 (eight) hours as needed.   albuterol (PROVENTIL) (2.5 MG/3ML) 0.083% nebulizer solution USE 1 VIAL VIA NEBULIZER EVERY 6 HOURS AS NEEDED FOR WHEEZING OR SHORTNESS OF BREATH   albuterol (VENTOLIN HFA) 108 (90 Base) MCG/ACT inhaler Inhale 2 puffs into the lungs every 6 (six) hours as needed for wheezing or shortness of breath.   aspirin 81 MG chewable tablet Chew 81  mg by mouth as needed (for general health).   atorvastatin (LIPITOR) 40 MG tablet Take 1 tablet (40 mg total) by mouth daily.   busPIRone (BUSPAR) 10 MG tablet Take 0.5-1 tablets (5-10 mg total) by mouth 2 (two) times daily as needed. For anxiety   cetirizine (ZYRTEC) 10 MG tablet Take 1 tablet (10 mg total) by mouth 2 (two) times daily as needed for allergies (During flare ups).   DULoxetine (CYMBALTA) 60 MG capsule TAKE 1 CAPSULE(60 MG) BY MOUTH DAILY   fluticasone (FLONASE) 50 MCG/ACT nasal spray Place 2 sprays into both nostrils daily.   fluticasone-salmeterol (ADVAIR HFA) 230-21 MCG/ACT inhaler INHALE 2 PUFFS INTO THE LUNGS TWICE DAILY   ipratropium (ATROVENT) 0.03 % nasal spray Place 2 sprays into both nostrils 3 (three) times daily.   losartan-hydrochlorothiazide (HYZAAR) 100-25 MG tablet Take 1 tablet by mouth daily.   omeprazole (PRILOSEC) 20 MG capsule Take 1 capsule (20 mg total) by mouth daily.   Tiotropium Bromide Monohydrate (SPIRIVA RESPIMAT) 2.5 MCG/ACT AERS Two puff daily   tiZANidine (ZANAFLEX) 4 MG tablet 1/2 - 1 tablet every 6 hours as needed for muscle spasm   No facility-administered encounter medications on file as of 10/16/2022.    Allergies (verified) Patient has no known allergies.   History: Past Medical History:  Diagnosis Date   COPD (chronic obstructive pulmonary disease) (HCC)    Hypertension    Sarcoidosis    Past Surgical History:  Procedure Laterality Date   CHOLECYSTECTOMY N/A 07/19/2019   Procedure:  LAPAROSCOPIC CHOLECYSTECTOMY WITH INTRAOPERATIVE CHOLANGIOGRAM;  Surgeon: Gaynelle Adu, MD;  Location: [facility] OR;  Service: General;  Laterality: N/A;   Family History  Family history unknown: Yes   Social History   Socioeconomic History   Marital status: Widowed    Spouse name: Not on file   Number of children: Not on file   Years of education: Not on file   Highest education level: Not on file  Occupational History   Not on file  Tobacco Use    Smoking status: Former    Current packs/day: 0.00    Types: Cigarettes    Start date: 01/23/1960    Quit date: 01/22/2010    Years since quitting: 12.7   Smokeless tobacco: Never  Vaping Use   Vaping status: Former  Substance and Sexual Activity   Alcohol use: Not Currently   Drug use: Never   Sexual activity: Not on file  Other Topics Concern   Not on file  Social History Narrative   Not on file   Social Determinants of Health   Financial Resource Strain: Low Risk  (10/16/2022)   Overall Financial Resource Strain (CARDIA)    Difficulty of Paying Living Expenses: Not hard at all  Food Insecurity: No Food Insecurity (10/16/2022)   Hunger Vital Sign    Worried About Running Out of Food in the Last Year: Never true    Ran Out of Food in the Last Year: Never true  Transportation Needs: Unmet Transportation Needs (10/16/2022)   PRAPARE - Administrator, Civil Service (Medical): Yes    Lack of Transportation (Non-Medical): No  Physical Activity: Inactive (10/16/2022)   Exercise Vital Sign    Days of Exercise per Week: 0 days    Minutes of Exercise per Session: 0 min  Stress: Stress Concern Present (10/16/2022)   [employer] of Occupational Health - Occupational Stress Questionnaire    Feeling of Stress : To some extent  Social Connections: Socially Integrated (10/16/2022)   Social Connection and Isolation Panel [NHANES]    Frequency of Communication with Friends and Family: More than three times a week    Frequency of Social Gatherings with Friends and Family: Three times a week    Attends Religious Services: More than 4 times per year    Active Member of Clubs or Organizations: Yes    Attends Banker Meetings: 1 to 4 times per year    Marital Status: Married    Tobacco Counseling Counseling given: Not Answered   Clinical Intake:  Pre-visit preparation completed: Yes  Pain : No/denies pain     Diabetes: No  How often do you need to have  someone help you when you read instructions, pamphlets, or other written materials from your doctor or pharmacy?: 1 - Never  Interpreter Needed?: No  Information entered by :: Kandis Fantasia LPN   Activities of Daily Living    10/16/2022    5:21 PM 01/09/2022    4:28 PM  In your present state of health, do you have any difficulty performing the following activities:  Hearing? 0 0  Vision? 0 0  Difficulty concentrating or making decisions? 0 0  Walking or climbing stairs? 0 1  Dressing or bathing? 0 0  Doing errands, shopping? 0 0  Preparing Food and eating ? N N  Using the Toilet? N N  In the past six months, have you accidently leaked urine? N Y  Do you have problems with loss of bowel control?  N N  Managing your Medications? N N  Managing your Finances? N N  Housekeeping or managing your Housekeeping? N N    Patient Care Team: Claiborne Rigg, NP as PCP - General (Nurse Practitioner)  Indicate any recent Medical Services you may have received from other than [facility] providers in the past year (date may be approximate).     Assessment:   This is a routine wellness examination for [place].  Hearing/Vision screen Hearing Screening - Comments:: Denies hearing difficulties   Vision Screening - Comments:: No vision problems; will schedule routine eye exam soon     Goals Addressed             This Visit's Progress    Obtain grief counseling       COMPLETED: Patient Stated       01/09/2022, wants to get a little stronger and get more mobility      Depression Screen    10/16/2022    5:17 PM 06/20/2022    3:01 PM 01/09/2022    4:25 PM 05/10/2020   10:29 AM 11/16/2019    8:37 AM 10/20/2019   10:20 AM 07/13/2019    8:59 AM  PHQ 2/9 Scores  PHQ - 2 Score 3 4 5 3  0 2 2  PHQ- 9 Score 12 18 9 13 2 5 4     Fall Risk    10/16/2022    5:21 PM 06/20/2022    3:01 PM 01/09/2022    4:25 PM 05/10/2020   10:29 AM 11/16/2019    8:52 AM  Fall Risk   Falls in the past year?  0 0 0 0 0  Number falls in past yr: 0 0 0 0 0  Injury with Fall? 0 0 0 0 0  Risk for fall due to : No Fall Risks No Fall Risks Medication side effect    Follow up Falls prevention discussed;Education provided;Falls evaluation completed Falls evaluation completed Falls prevention discussed;Education provided;Falls evaluation completed      MEDICARE RISK AT HOME: Medicare Risk at Home Any stairs in or around the home?: No If so, are there any without handrails?: No Home free of loose throw rugs in walkways, pet beds, electrical cords, etc?: Yes Adequate lighting in your home to reduce risk of falls?: Yes Life alert?: No Use of a cane, walker or w/c?: No Grab bars in the bathroom?: Yes Shower chair or bench in shower?: No Elevated toilet seat or a handicapped toilet?: Yes  TIMED UP AND GO:  Was the test performed?  No    Cognitive Function:        10/16/2022    5:22 PM 01/09/2022    4:29 PM  6CIT Screen  What Year? 0 points 0 points  What month? 0 points 0 points  What time? 0 points 3 points  Count back from 20 0 points 0 points  Months in reverse 0 points 0 points  Repeat phrase 0 points 2 points  Total Score 0 points 5 points    Immunizations Immunization History  Administered Date(s) Administered   PFIZER(Purple Top)SARS-COV-2 Vaccination 05/14/2019, 06/04/2019   Pneumococcal Conjugate-13 07/13/2019   Tdap 07/13/2019    TDAP status: Up to date  Flu Vaccine status: Declined, Education has been provided regarding the importance of this vaccine but patient still declined. Advised may receive this vaccine at local pharmacy or Health Dept. Aware to provide a copy of the vaccination record if obtained from local pharmacy or Health Dept. Verbalized acceptance  and understanding.  Pneumococcal vaccine status: Due, Education has been provided regarding the importance of this vaccine. Advised may receive this vaccine at local pharmacy or Health Dept. Aware to provide a copy  of the vaccination record if obtained from local pharmacy or Health Dept. Verbalized acceptance and understanding.  Covid-19 vaccine status: Declined, Education has been provided regarding the importance of this vaccine but patient still declined. Advised may receive this vaccine at local pharmacy or Health Dept.or vaccine clinic. Aware to provide a copy of the vaccination record if obtained from local pharmacy or Health Dept. Verbalized acceptance and understanding.  Qualifies for Shingles Vaccine? Yes   Zostavax completed No   Shingrix Completed?: No.    Education has been provided regarding the importance of this vaccine. Patient has been advised to call insurance company to determine out of pocket expense if they have not yet received this vaccine. Advised may also receive vaccine at local pharmacy or Health Dept. Verbalized acceptance and understanding.  Screening Tests Health Maintenance  Topic Date Due   Diabetic kidney evaluation - Urine ACR  Never done   Zoster Vaccines- Shingrix (1 of 2) Never done   COVID-19 Vaccine (3 - 2023-24 season) 09/23/2022   Pneumonia Vaccine 44+ Years old (2 of 2 - PPSV23 or PCV20) 01/10/2023 (Originally 09/07/2019)   MAMMOGRAM  01/10/2023 (Originally 09/13/1968)   Fecal DNA (Cologuard)  01/10/2023 (Originally 09/13/1994)   Diabetic kidney evaluation - eGFR measurement  06/20/2023   Medicare Annual Wellness (AWV)  10/16/2023   DTaP/Tdap/Td (2 - Td or Tdap) 07/12/2029   HPV VACCINES  Aged Out   INFLUENZA VACCINE  Discontinued   DEXA SCAN  Discontinued   Hepatitis C Screening  Discontinued    Health Maintenance  Health Maintenance Due  Topic Date Due   Diabetic kidney evaluation - Urine ACR  Never done   Zoster Vaccines- Shingrix (1 of 2) Never done   COVID-19 Vaccine (3 - 2023-24 season) 09/23/2022   Patient declines all screenings a this time   Lung Cancer Screening: (Low Dose CT Chest recommended if Age 32-80 years, 20 pack-year currently smoking  OR have quit w/in 15years.) does not qualify.   Lung Cancer Screening Referral: n/a  Additional Screening:  Hepatitis C Screening: does qualify  Vision Screening: Recommended annual ophthalmology exams for early detection of glaucoma and other disorders of the eye. Is the patient up to date with their annual eye exam?  No  Who is the provider or what is the name of the office in which the patient attends annual eye exams? none If pt is not established with a provider, would they like to be referred to a provider to establish care? No .   Dental Screening: Recommended annual dental exams for proper oral hygiene  Community Resource Referral / Chronic Care Management: CRR required this visit?  No   CCM required this visit?  No     Plan:     I have personally reviewed and noted the following in the patient's chart:   Medical and social history Use of alcohol, tobacco or illicit drugs  Current medications and supplements including opioid prescriptions. Patient is not currently taking opioid prescriptions. Functional ability and status Nutritional status Physical activity Advanced directives List of other physicians Hospitalizations, surgeries, and ER visits in previous 12 months Vitals Screenings to include cognitive, depression, and falls Referrals and appointments  In addition, I have reviewed and discussed with patient certain preventive protocols, quality metrics, and [facility] practice recommendations.  A written personalized care plan for preventive services as well as general preventive health recommendations were provided to patient.     Kandis Fantasia [place], [place]   04/30/8117   After Visit Summary: (MyChart) Due to this being a telephonic visit, the after visit summary with patients personalized plan was offered to patient via MyChart   Nurse Notes: Patient would like to get assistance with grief counseling.

## 2022-10-18 ENCOUNTER — Encounter: Payer: Self-pay | Admitting: Nurse Practitioner

## 2022-10-18 ENCOUNTER — Other Ambulatory Visit: Payer: Self-pay | Admitting: Nurse Practitioner

## 2022-10-18 DIAGNOSIS — G8929 Other chronic pain: Secondary | ICD-10-CM

## 2022-10-18 MED ORDER — TIZANIDINE HCL 4 MG PO TABS
ORAL_TABLET | ORAL | 2 refills | Status: DC
Start: 2022-10-18 — End: 2023-03-29

## 2022-10-23 DIAGNOSIS — R0902 Hypoxemia: Secondary | ICD-10-CM | POA: Diagnosis not present

## 2022-11-04 NOTE — Progress Notes (Signed)
I have reviewed and agree with the AWV documentation Attestation signed by Bertram Denver FNP-BC on  10-16-2022

## 2022-11-14 DIAGNOSIS — R0902 Hypoxemia: Secondary | ICD-10-CM | POA: Diagnosis not present

## 2022-11-23 DIAGNOSIS — R0902 Hypoxemia: Secondary | ICD-10-CM | POA: Diagnosis not present

## 2022-12-12 MED ORDER — FLUTICASONE-SALMETEROL 230-21 MCG/ACT IN AERO: 2.0000 | INHALATION_SPRAY | Freq: Two times a day (BID) | RESPIRATORY_TRACT | 0 refills | Status: DC

## 2022-12-12 MED ORDER — SPIRIVA RESPIMAT 2.5 MCG/ACT IN AERS: INHALATION_SPRAY | RESPIRATORY_TRACT | 0 refills | Status: DC

## 2022-12-12 NOTE — Addendum Note (Signed)
Addended by: Noemi Chapel on: 12/12/2022 04:27 PM   Modules accepted: Orders

## 2022-12-15 DIAGNOSIS — R0902 Hypoxemia: Secondary | ICD-10-CM | POA: Diagnosis not present

## 2022-12-17 NOTE — Telephone Encounter (Signed)
Meds filled 11/20. Appt for 12/4. NFN

## 2022-12-23 DIAGNOSIS — R0902 Hypoxemia: Secondary | ICD-10-CM | POA: Diagnosis not present

## 2022-12-26 ENCOUNTER — Ambulatory Visit: Payer: Medicare Other | Admitting: Nurse Practitioner

## 2023-01-14 DIAGNOSIS — R0902 Hypoxemia: Secondary | ICD-10-CM | POA: Diagnosis not present

## 2023-01-17 ENCOUNTER — Other Ambulatory Visit: Payer: Self-pay | Admitting: Nurse Practitioner

## 2023-01-18 NOTE — Telephone Encounter (Signed)
Pt was last seen 03-28-22. Pt is requesting refill but the lov does not state anything about medication. Please advise.

## 2023-01-22 NOTE — Telephone Encounter (Signed)
He was seen in March for lung cancer screening, not an OV with [place]. Ok to refill until she can be seen in office but has to have a follow up.

## 2023-01-23 DIAGNOSIS — R0902 Hypoxemia: Secondary | ICD-10-CM | POA: Diagnosis not present

## 2023-02-14 DIAGNOSIS — R0902 Hypoxemia: Secondary | ICD-10-CM | POA: Diagnosis not present

## 2023-02-15 ENCOUNTER — Telehealth: Payer: Medicare Other | Admitting: Nurse Practitioner

## 2023-02-15 DIAGNOSIS — D869 Sarcoidosis, unspecified: Secondary | ICD-10-CM

## 2023-02-15 DIAGNOSIS — J9611 Chronic respiratory failure with hypoxia: Secondary | ICD-10-CM | POA: Diagnosis not present

## 2023-02-15 DIAGNOSIS — Z87891 Personal history of nicotine dependence: Secondary | ICD-10-CM

## 2023-02-15 DIAGNOSIS — J4489 Other specified chronic obstructive pulmonary disease: Secondary | ICD-10-CM

## 2023-02-15 NOTE — Patient Instructions (Addendum)
Continue Albuterol inhaler 2 puffs or 3 mL neb every 6 hours as needed for shortness of breath or wheezing. Notify if symptoms persist despite rescue inhaler/neb use. Continue Spiriva 2 puffs daily Continue Advair 2 puffs Twice daily. Brush tongue and rinse mouth afterwards  Continue Zyrtec 10 mg twice daily as needed for allergies Continue Flonase 2 sprays each nostril daily as needed for allergies Continue Atrovent nasal spray 2 sprays each nostril 3 times a day     Follow up in one month with Dr. Tonia Brooms or Florentina Addison Cristiano Capri,NP. If symptoms do not improve or worsen, please contact office for sooner follow up or seek emergency care.

## 2023-02-15 NOTE — Progress Notes (Unsigned)
Patient ID: Carolyn Morton, female     DOB: [birth year], 73 y.o.      MRN: [id number]  No chief complaint on file.   Virtual Visit via Video Note  I connected with Carolyn Morton on 02/18/23 at  2:00 PM EST by a video enabled telemedicine application and verified that I am speaking with the correct person using two identifiers.  Location: Patient: Home Provider: Office   I discussed the limitations of evaluation and management by telemedicine and the availability of in person appointments. The patient expressed understanding and agreed to proceed.  History of Present Illness: 73 year old female, former smoker followed for severe COPD with asthma overlap, sarcoidosis, chronic respiratory failure. She is a patient of Dr. Myrlene Broker and last seen via virtual visit 07/06/2021 by [facility] NP.  Past medical history significant for hypertension, allergic rhinitis, GERD, depression, obesity.  TEST/EVENTS: 12/14/2020 PFT: FVC 52, FEV1 42, ratio 75, TLC 81, DLCOcor 43. Positive bronchodilator response (32%) 06/15/2022 LDCT chest: atherosclerosis. Mild centrilobular emphysema with diffuse bronchial wall thickening. Few scattered solid nodules, largest 5.6 mm in the RUL, unchanged. Lung RADS 2. Chronic small pericardial effusion.   02/16/2021: OV with Dr. Tonia Brooms for follow-up.  Recent CT scan of the chest was reassuring.  No systemic evidence of sarcoidosis.  No longer smoking.  Using continuous supplemental O2 as needed.  Had issues with POC before and felt like she was not getting enough.  Continued on triple therapy regimen with Advair and Spiriva.  Recommended considering enrollment in lung cancer screening program.  07/06/2021: Virtual visit Kimsey Demaree NP for increased wheezing and productive cough with yellow sputum.  Her symptoms started around a week ago but at that time her cough was minimally productive.  She has also noticed some increased shortness of breath with exertion.  She did run out of her Spiriva around  the same time.  The pharmacy had been out of stock.  She was able to get this refilled this week and has since switched to [facility] for delivery meds. Denies any fevers, chills, night sweats, anorexia, hemoptysis, lower extremity swelling.  She has been using her oxygen more frequently during the day. Oxygen levels have remained above 88% on 2 L/min. No systemic symptoms of sarcoid.  02/15/2023: Today - follow up Patient presents today for follow up. She's been doing relatively well from a respiratory standpoint. Unfortunately, she lost her child and has been working through the grief process. She does feel like she's coping effectively but understandably, still has trouble from time to time. She denies any SI/HI. She feels like her mood has slowly improved. She did have one exacerbation in August requiring steroids/abx. No hospitalizations. She does not have to use her rescue often. Using her Spiriva and Advair. No increased oxygen requirements. Follows with the lung cancer screening program.  No Known Allergies Immunization History  Administered Date(s) Administered   PFIZER(Purple Top)SARS-COV-2 Vaccination 05/14/2019, 06/04/2019   Pneumococcal Conjugate-13 07/13/2019   Tdap 07/13/2019   Past Medical History:  Diagnosis Date   COPD (chronic obstructive pulmonary disease) (HCC)    Hypertension    Sarcoidosis     Tobacco History: Social History   Tobacco Use  Smoking Status Former   Current packs/day: 0.00   Types: Cigarettes   Start date: 01/23/1960   Quit date: 01/22/2010   Years since quitting: 13.0  Smokeless Tobacco Never   Counseling given: Not Answered   Outpatient Medications Prior to Visit  Medication Sig Dispense Refill   acetaminophen (  TYLENOL) 500 MG tablet Take 2 tablets (1,000 mg total) by mouth every 8 (eight) hours as needed. 30 tablet 0   albuterol (PROVENTIL) (2.5 MG/3ML) 0.083% nebulizer solution USE 1 VIAL VIA NEBULIZER EVERY 6 HOURS AS NEEDED FOR WHEEZING OR  SHORTNESS OF BREATH 360 mL 3   albuterol (VENTOLIN HFA) 108 (90 Base) MCG/ACT inhaler Inhale 2 puffs into the lungs every 6 (six) hours as needed for wheezing or shortness of breath. 24 g 3   aspirin 81 MG chewable tablet Chew 81 mg by mouth as needed (for general health).     atorvastatin (LIPITOR) 40 MG tablet Take 1 tablet (40 mg total) by mouth daily. 90 tablet 3   busPIRone (BUSPAR) 10 MG tablet Take 0.5-1 tablets (5-10 mg total) by mouth 2 (two) times daily as needed. For anxiety 180 tablet 1   cetirizine (ZYRTEC) 10 MG tablet Take 1 tablet (10 mg total) by mouth 2 (two) times daily as needed for allergies (During flare ups). 60 tablet 5   DULoxetine (CYMBALTA) 60 MG capsule TAKE 1 CAPSULE(60 MG) BY MOUTH DAILY 30 capsule 0   fluticasone (FLONASE) 50 MCG/ACT nasal spray Place 2 sprays into both nostrils daily. 16 g 6   fluticasone-salmeterol (ADVAIR HFA) 230-21 MCG/ACT inhaler INHALE 2 PUFFS INTO THE LUNGS TWICE DAILY 12 g 0   ipratropium (ATROVENT) 0.03 % nasal spray Place 2 sprays into both nostrils 3 (three) times daily. 30 mL 5   losartan-hydrochlorothiazide (HYZAAR) 100-25 MG tablet Take 1 tablet by mouth daily. 90 tablet 1   omeprazole (PRILOSEC) 20 MG capsule Take 1 capsule (20 mg total) by mouth daily. 90 capsule 1   Tiotropium Bromide Monohydrate (SPIRIVA RESPIMAT) 2.5 MCG/ACT AERS Two puff daily 4 g 0   tiZANidine (ZANAFLEX) 4 MG tablet 1/2 - 1 tablet every 6 hours as needed for muscle spasm 30 tablet 2   No facility-administered medications prior to visit.     Review of Systems:   Constitutional: No weight loss or gain, night sweats, fevers, chills, fatigue, or lassitude. HEENT: No headaches, difficulty swallowing, tooth/dental problems, or sore throat. No sneezing, itching, ear ache, nasal congestion, or post nasal drip CV:  No chest pain, orthopnea, PND, swelling in lower extremities, anasarca, dizziness, palpitations, syncope Resp: +shortness of breath with exertion  (baseline); occasional cough. No wheeze. No hemoptysis. No chest wall deformity GI:  No heartburn, indigestion Skin: No rash, lesions, ulcerations MSK:  No joint pain or swelling.  Neuro: No dizziness or lightheadedness.  Psych: +grief. No SI/HI. Mood stable.   Observations/Objective: Patient is well-developed, well-nourished in no acute distress; A&Ox3. Resting comfortably at home. Unlabored, regular breathing on supplemental oxygen. Speech is clear and coherent with logical content.  Assessment and Plan: COPD with asthma (HCC) Severe COPD with asthma overlap and sarcoidosis. Moderate symptom burden. Stable on current regimen. Last exacerbation August 2024. No recent hospitalizations. Encouraged to work on graded exercises. Action plan in place.  Patient Instructions  Continue Albuterol inhaler 2 puffs or 3 mL neb every 6 hours as needed for shortness of breath or wheezing. Notify if symptoms persist despite rescue inhaler/neb use. Continue Spiriva 2 puffs daily Continue Advair 2 puffs Twice daily. Brush tongue and rinse mouth afterwards  Continue Zyrtec 10 mg twice daily as needed for allergies Continue Flonase 2 sprays each nostril daily as needed for allergies Continue Atrovent nasal spray 2 sprays each nostril 3 times a day   Follow up in 6 months with new pulmonologist. If symptoms  do not improve or worsen, please contact office for sooner follow up or seek emergency care.   Chronic respiratory failure with hypoxia (HCC) Stable without increased O2 requirement. Goal >88-90%  Sarcoidosis No active disease on imaging. Continue to monitor.  Former smoker Lung RADS 24 May 2022. Followed by lung cancer screening. Follow up May 2025.       I discussed the assessment and treatment plan with the patient. The patient was provided an opportunity to ask questions and all were answered. The patient agreed with the plan and demonstrated an understanding of the instructions.   The  patient was advised to call back or seek an in-person evaluation if the symptoms worsen or if the condition fails to improve as anticipated.  I provided 21 minutes of non-face-to-face time during this encounter.   Noemi Chapel, NP

## 2023-02-17 IMAGING — CT CT CHEST HIGH RESOLUTION
2 of 7 series · 14 of 36 positions shown, 17 images · non-contrast
Comparison: None.

CLINICAL DATA: Sarcoidosis

EXAM:
CT CHEST WITHOUT CONTRAST
TECHNIQUE: Multidetector CT imaging of the chest was performed following the
standard protocol without intravenous contrast. High resolution
imaging of the lungs, as well as inspiratory and expiratory imaging,
was performed.

[Series 4: high resolution · axial · 0.72mm/px · z∈[-306,-58]mm · 11 of 298 slices shown, 14 images]
[im 25/298  mediastinal]
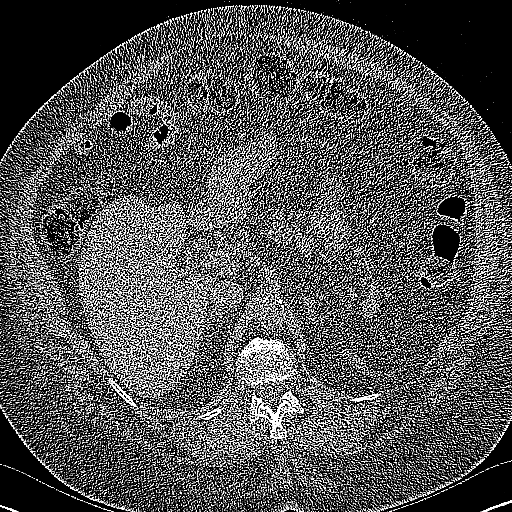
[im 25/298  lung]
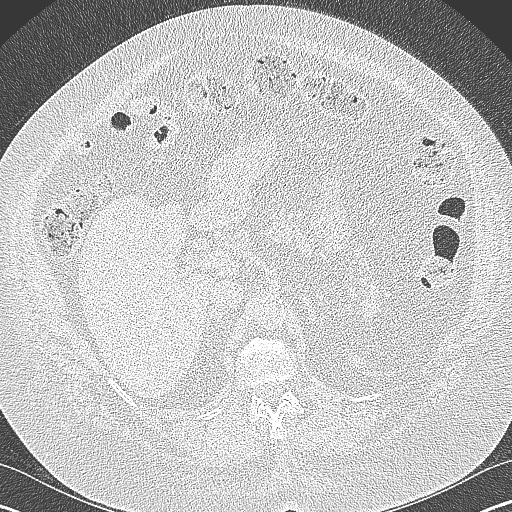
[im 50/298  lung]
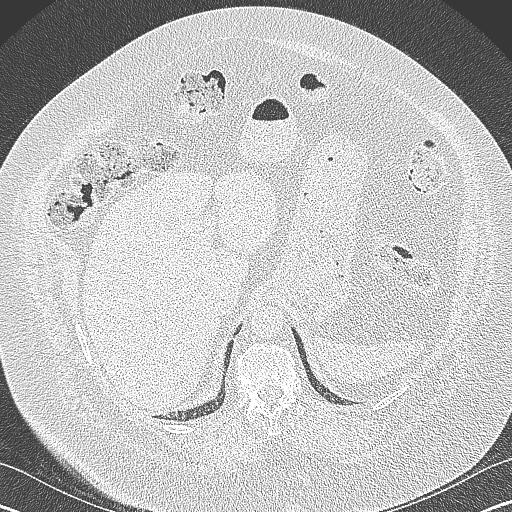
[im 75/298  lung]
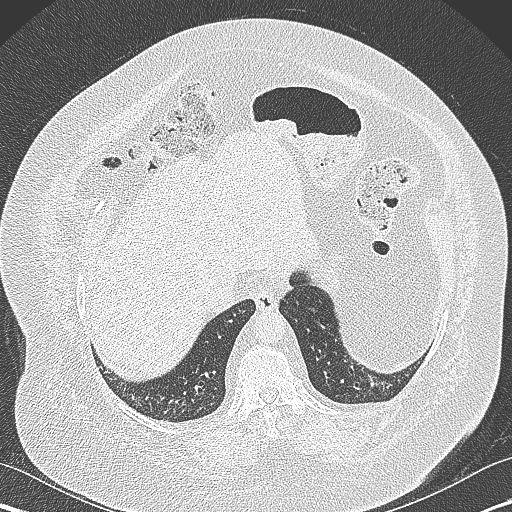
[im 100/298  lung]
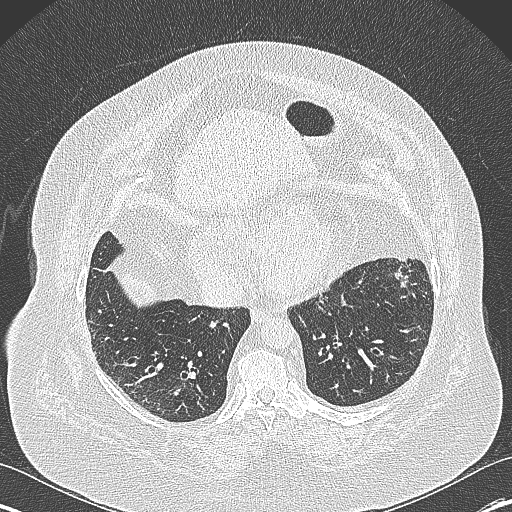
[im 124/298  mediastinal]
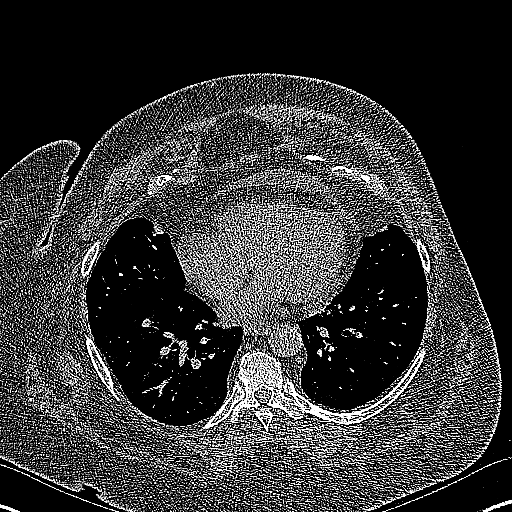
[im 124/298  lung]
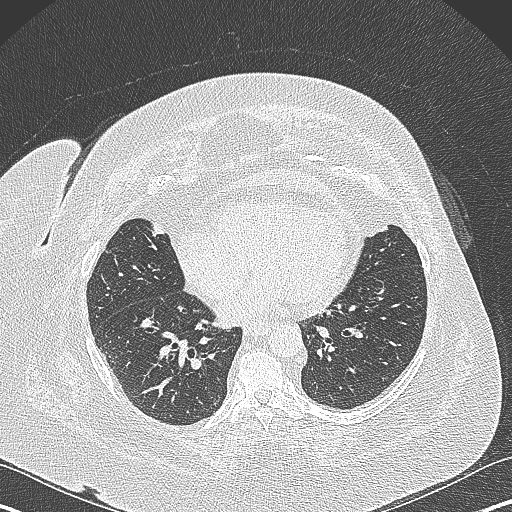
[im 149/298  lung]
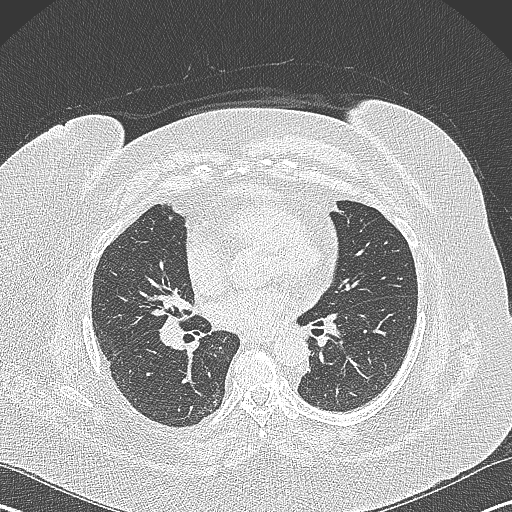
[im 174/298  lung]
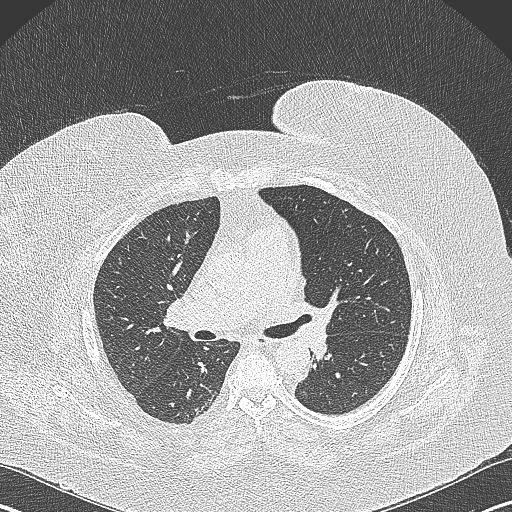
[im 199/298  lung]
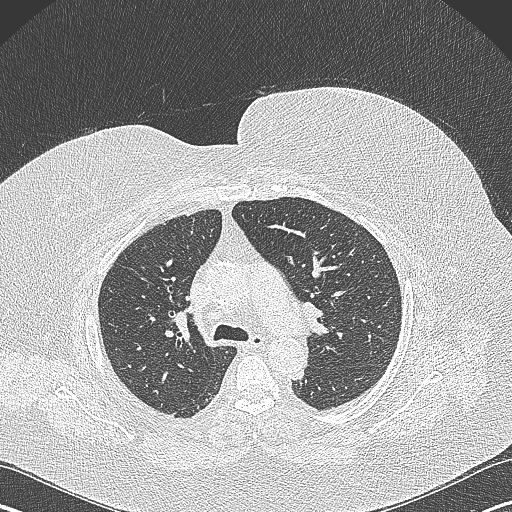
[im 223/298  mediastinal]
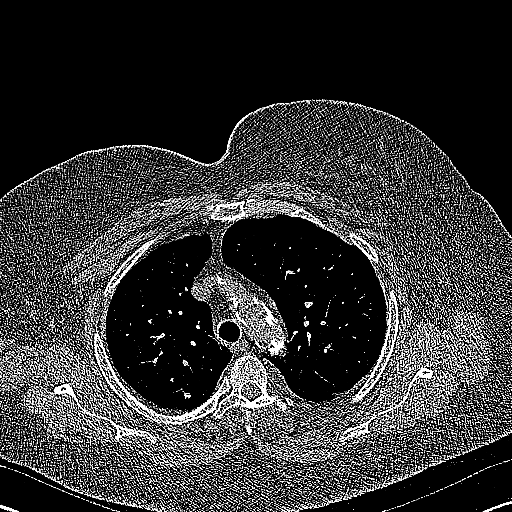
[im 223/298  lung]
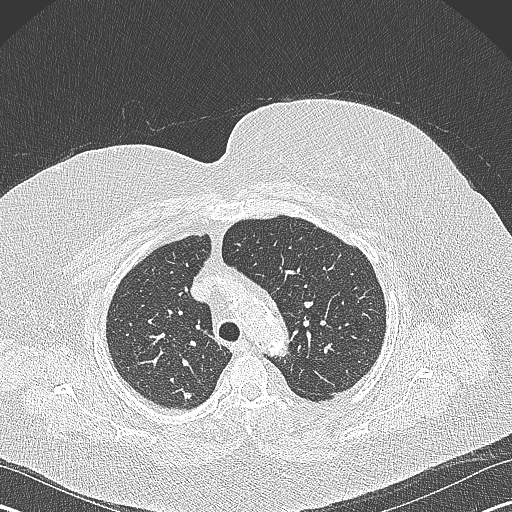
[im 248/298  lung]
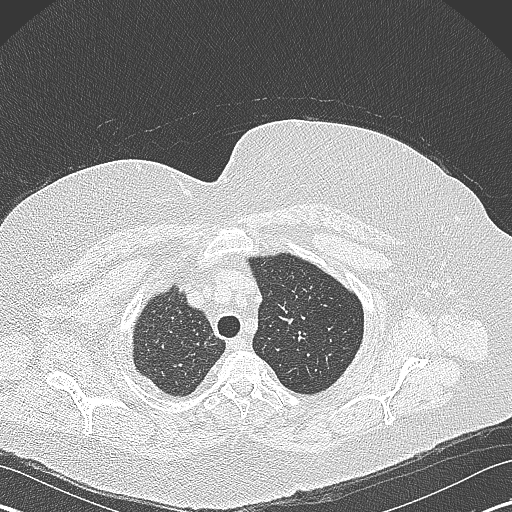
[im 273/298  lung]
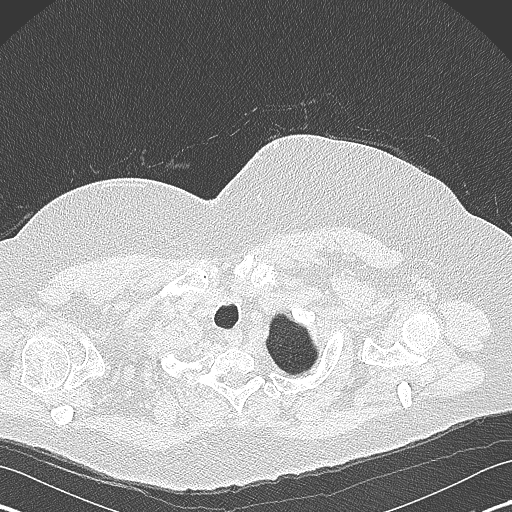

[Series 6: coronal · coronal · 0.59mm/px · 3 of 123 slices shown]
[im 25/123  lung]
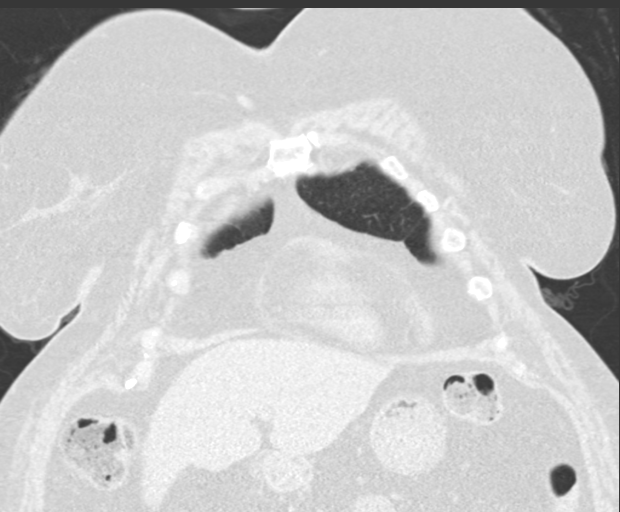
[im 49/123  lung]
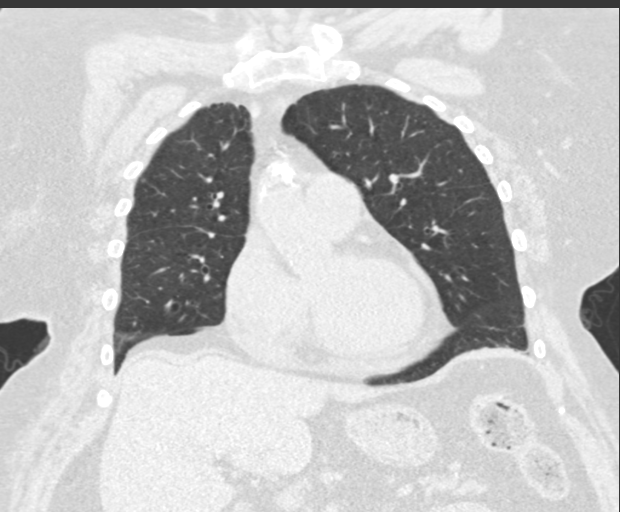
[im 74/123  lung]
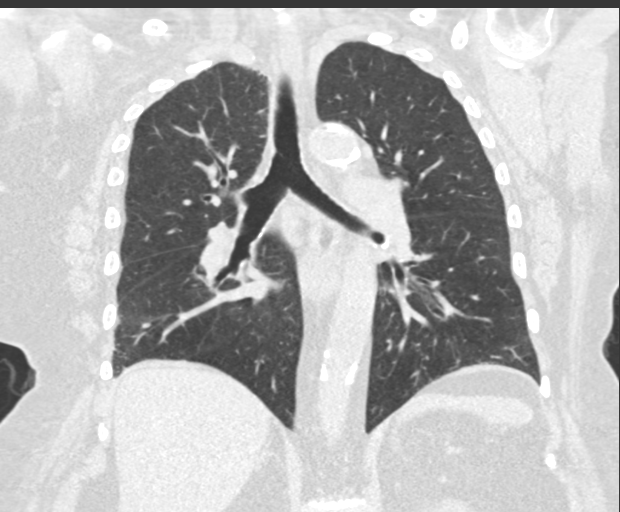

[14 of 36 positions shown; findings below may reference images not displayed]

FINDINGS: Cardiovascular: Aortic atherosclerosis. Normal heart size. Small
pericardial effusion.

Mediastinum/Nodes: No enlarged mediastinal, hilar, or axillary lymph
nodes. Thyroid gland, trachea, and esophagus demonstrate no
significant findings.

Lungs/Pleura: There is minimal, predominantly dependent, bland
appearing scarring of the bilateral lung bases. No significant
nodularity or overt fibrosis. No significant air trapping on
expiratory phase imaging. Benign 0.6 cm fissural nodule of the right
middle lobe abutting the right minor fissure. No pleural effusion or
pneumothorax.

Upper Abdomen: No acute abnormality.  Status post cholecystectomy.

Musculoskeletal: No chest wall mass or suspicious bone lesions
identified.
IMPRESSION: 1. There is minimal, predominantly dependent, bland appearing
scarring of the bilateral lung bases. No significant nodularity or
overt fibrosis. No specific findings of pulmonary sarcoidosis.
2. No lymphadenopathy or mediastinal lymph node calcifications to
suggest nodal sarcoidosis.
3. Small pericardial effusion.

Aortic Atherosclerosis ([id number]-[id number]).

## 2023-02-18 ENCOUNTER — Encounter: Payer: Self-pay | Admitting: Nurse Practitioner

## 2023-02-18 DIAGNOSIS — Z87891 Personal history of nicotine dependence: Secondary | ICD-10-CM | POA: Insufficient documentation

## 2023-02-18 NOTE — Assessment & Plan Note (Signed)
No active disease on imaging. Continue to monitor.

## 2023-02-18 NOTE — Assessment & Plan Note (Signed)
Lung RADS 24 May 2022. Followed by lung cancer screening. Follow up May 2025.

## 2023-02-18 NOTE — Assessment & Plan Note (Signed)
Stable without increased O2 requirement. Goal >88-90%

## 2023-02-18 NOTE — Assessment & Plan Note (Signed)
Severe COPD with asthma overlap and sarcoidosis. Moderate symptom burden. Stable on current regimen. Last exacerbation August 2024. No recent hospitalizations. Encouraged to work on graded exercises. Action plan in place.  Patient Instructions  Continue Albuterol inhaler 2 puffs or 3 mL neb every 6 hours as needed for shortness of breath or wheezing. Notify if symptoms persist despite rescue inhaler/neb use. Continue Spiriva 2 puffs daily Continue Advair 2 puffs Twice daily. Brush tongue and rinse mouth afterwards  Continue Zyrtec 10 mg twice daily as needed for allergies Continue Flonase 2 sprays each nostril daily as needed for allergies Continue Atrovent nasal spray 2 sprays each nostril 3 times a day   Follow up in 6 months with new pulmonologist. If symptoms do not improve or worsen, please contact office for sooner follow up or seek emergency care.

## 2023-02-23 DIAGNOSIS — R0902 Hypoxemia: Secondary | ICD-10-CM | POA: Diagnosis not present

## 2023-03-10 ENCOUNTER — Encounter: Payer: Self-pay | Admitting: Nurse Practitioner

## 2023-03-13 NOTE — Telephone Encounter (Signed)
She needs an office visit. She has HTN

## 2023-03-17 DIAGNOSIS — R0902 Hypoxemia: Secondary | ICD-10-CM | POA: Diagnosis not present

## 2023-03-23 DIAGNOSIS — R0902 Hypoxemia: Secondary | ICD-10-CM | POA: Diagnosis not present

## 2023-03-25 ENCOUNTER — Telehealth: Payer: Self-pay | Admitting: Student

## 2023-03-25 NOTE — Telephone Encounter (Signed)
 PT would like Spiriva & Advair called in. Her # is [phone]  [facility] [phone] [address]  PA will be needed for Advair for [employer]

## 2023-03-26 ENCOUNTER — Telehealth: Payer: Self-pay

## 2023-03-26 ENCOUNTER — Other Ambulatory Visit (HOSPITAL_COMMUNITY): Payer: Self-pay

## 2023-03-26 DIAGNOSIS — J4489 Other specified chronic obstructive pulmonary disease: Secondary | ICD-10-CM

## 2023-03-26 DIAGNOSIS — G8929 Other chronic pain: Secondary | ICD-10-CM

## 2023-03-26 NOTE — Telephone Encounter (Signed)
 Pharmacy Patient Advocate Encounter   Received notification from CoverMyMeds that prior authorization for Advair HFA 230-21MCG/ACT aerosol is required/requested.   Insurance verification completed.   The patient is insured through [facility] Medicare Part D.   Per test claim: Prior Authorization form/request asks a question that requires your assistance. Please see the question below and advise accordingly. The PA will not be submitted until the necessary information is received.

## 2023-03-26 NOTE — Telephone Encounter (Signed)
 Advair HFA is available for patient with a transition fill for a co-pay of $331.39. Patient does have deductible. New encounter will be created for prior authorization status and updates.

## 2023-03-27 NOTE — Telephone Encounter (Signed)
 Notes state she only stopped Breo due to cost. Insurance will not accept this as a try/fail. Were there any other inhalers used that did not work or she had intolerance to?

## 2023-03-27 NOTE — Telephone Encounter (Signed)
 Patient has been on [place] in past. Virgel Bouquet note states that she was unable to afford and Elwin Sleight states it was D/C when patient was discharged. Please advise.

## 2023-03-29 ENCOUNTER — Telehealth: Payer: Self-pay | Admitting: Nurse Practitioner

## 2023-03-29 MED ORDER — FLUTICASONE-SALMETEROL 250-50 MCG/ACT IN AEPB
1.0000 | INHALATION_SPRAY | Freq: Two times a day (BID) | RESPIRATORY_TRACT | 11 refills | Status: DC
Start: 2023-03-29 — End: 2023-03-29

## 2023-03-29 MED ORDER — FLUTICASONE-SALMETEROL 250-50 MCG/ACT IN AEPB: 1.0000 | INHALATION_SPRAY | Freq: Two times a day (BID) | RESPIRATORY_TRACT | 11 refills | Status: AC

## 2023-03-29 MED ORDER — TIZANIDINE HCL 4 MG PO TABS: ORAL_TABLET | ORAL | 2 refills | Status: AC

## 2023-03-29 NOTE — Telephone Encounter (Signed)
 Spoke to patient. She is aware that Advair Diskus is preferred by insurance. She voiced her understanding.  Nothing further needed.

## 2023-03-29 NOTE — Telephone Encounter (Signed)
 Called and spoke to patient.  C/o wheezing.  Denied f/c/s or additional.  SOB is baseline.  She is using albuterol HFA q6h and albuterol solution BID.  She has been without Advair for one week. Spiriva sent to pharmacy today. She hopes to start it today.   KC, please advise. Thanks

## 2023-03-29 NOTE — Telephone Encounter (Signed)
 Patient is wondering why the pharmacy has given her Advair as a powder and as an inhaler. Please call and advise.

## 2023-03-29 NOTE — Telephone Encounter (Signed)
 I sent updated rx today for the Advair diskus that is supposed to be covered on insurance. If breathing is at baseline, recommend she start this today and monitor response. Call next week if wheezing does not improve or develops increased SOB.

## 2023-03-29 NOTE — Telephone Encounter (Signed)
**Note De-identified  [place] Obfuscation** Please advise 

## 2023-03-29 NOTE — Telephone Encounter (Signed)
 ATC X1. Lmtcb. Sending [employer].

## 2023-03-29 NOTE — Telephone Encounter (Signed)
 I sent in Advair diskus - it will be 1 puff Twice daily instead of 2 puffs so just make sure she is aware of this. Proper oral hygiene following inhaler use. Side effect profile is otherwise the same as her current Advair.  Does she need the Spiriva refilled too or is that one okay? Thanks!

## 2023-03-29 NOTE — Telephone Encounter (Signed)
 Pt is aware of below message and voiced her understanding. Nothing further needed.

## 2023-04-14 DIAGNOSIS — R0902 Hypoxemia: Secondary | ICD-10-CM | POA: Diagnosis not present

## 2023-04-23 DIAGNOSIS — R0902 Hypoxemia: Secondary | ICD-10-CM | POA: Diagnosis not present

## 2023-05-09 NOTE — Telephone Encounter (Signed)
 NFN

## 2023-05-15 DIAGNOSIS — R0902 Hypoxemia: Secondary | ICD-10-CM | POA: Diagnosis not present

## 2023-05-23 DIAGNOSIS — R0902 Hypoxemia: Secondary | ICD-10-CM | POA: Diagnosis not present

## 2023-06-14 DIAGNOSIS — R0902 Hypoxemia: Secondary | ICD-10-CM | POA: Diagnosis not present

## 2023-06-21 ENCOUNTER — Telehealth: Payer: Self-pay | Admitting: Acute Care

## 2023-06-21 NOTE — Telephone Encounter (Signed)
 Returned call from VM to schedule annual LDCT. No answer. Left VM to call back

## 2023-06-23 DIAGNOSIS — R0902 Hypoxemia: Secondary | ICD-10-CM | POA: Diagnosis not present

## 2023-06-24 NOTE — Telephone Encounter (Signed)
 advise

## 2023-06-24 NOTE — Telephone Encounter (Signed)
 Needs to go to lung cancer screening program for CT reschedule  She will have to get the [employer] form for us  to sign. Can drop off at the front. Thanks.

## 2023-06-25 ENCOUNTER — Telehealth: Payer: Self-pay

## 2023-06-25 NOTE — Telephone Encounter (Signed)
 Copied from CRM [id number]. Topic: General - Other >> Jun 21, 2023  1:33 PM Hilton Lucky wrote: Reason for CRM: Patient is calling to request that her provider assist her with managing her electric bill. She states the bill has been going up and up, she thinks due to her being on oxygen  24/7. Patient attempted to inquire with [employer] and they were unable to assist her over several representatives. Patient is requesting provider assistance with this matter.  I tried calling the pt there was no answer. Left detailed vm per dpr stating that the pt will need to contact [employer] and get a form for Katie to sign.

## 2023-06-26 ENCOUNTER — Other Ambulatory Visit: Payer: Self-pay | Admitting: Acute Care

## 2023-06-26 DIAGNOSIS — Z122 Encounter for screening for malignant neoplasm of respiratory organs: Secondary | ICD-10-CM

## 2023-06-26 DIAGNOSIS — Z87891 Personal history of nicotine dependence: Secondary | ICD-10-CM

## 2023-07-16 ENCOUNTER — Other Ambulatory Visit (HOSPITAL_BASED_OUTPATIENT_CLINIC_OR_DEPARTMENT_OTHER): Payer: Self-pay

## 2023-07-16 MED ORDER — ALBUTEROL SULFATE HFA 108 (90 BASE) MCG/ACT IN AERS: 2.0000 | INHALATION_SPRAY | Freq: Four times a day (QID) | RESPIRATORY_TRACT | 3 refills | Status: AC | PRN

## 2023-07-17 ENCOUNTER — Ambulatory Visit (HOSPITAL_BASED_OUTPATIENT_CLINIC_OR_DEPARTMENT_OTHER)
Admission: RE | Admit: 2023-07-17 | Discharge: 2023-07-17 | Disposition: A | Discharge status: Home / Self Care | Source: Ambulatory Visit | Attending: Acute Care | Admitting: Acute Care

## 2023-07-17 DIAGNOSIS — Z122 Encounter for screening for malignant neoplasm of respiratory organs: Secondary | ICD-10-CM | POA: Diagnosis not present

## 2023-07-17 DIAGNOSIS — Z87891 Personal history of nicotine dependence: Secondary | ICD-10-CM | POA: Diagnosis not present

## 2023-07-25 ENCOUNTER — Other Ambulatory Visit: Payer: Self-pay | Admitting: Acute Care

## 2023-07-25 DIAGNOSIS — Z122 Encounter for screening for malignant neoplasm of respiratory organs: Secondary | ICD-10-CM

## 2023-07-25 DIAGNOSIS — Z87891 Personal history of nicotine dependence: Secondary | ICD-10-CM

## 2023-08-14 ENCOUNTER — Telehealth: Payer: Self-pay

## 2023-08-14 NOTE — Telephone Encounter (Signed)
 Received fax CMN from [facility]  for Oxygen  Concentrator 5 LPM.  Prepped CMN and placed on Beth's desk in B POD for signature.  Once signed, will fax back to [facility]  at [phone].  Forms faxed.

## 2023-09-06 NOTE — Telephone Encounter (Signed)
 error

## 2023-09-25 ENCOUNTER — Other Ambulatory Visit (HOSPITAL_BASED_OUTPATIENT_CLINIC_OR_DEPARTMENT_OTHER): Payer: Self-pay

## 2023-09-25 MED ORDER — SPIRIVA RESPIMAT 2.5 MCG/ACT IN AERS: INHALATION_SPRAY | RESPIRATORY_TRACT | 0 refills | Status: AC

## 2023-10-16 DIAGNOSIS — Z9981 Dependence on supplemental oxygen: Secondary | ICD-10-CM | POA: Diagnosis not present

## 2023-10-16 DIAGNOSIS — J441 Chronic obstructive pulmonary disease with (acute) exacerbation: Secondary | ICD-10-CM | POA: Diagnosis not present

## 2023-10-16 DIAGNOSIS — I10 Essential (primary) hypertension: Secondary | ICD-10-CM | POA: Diagnosis not present

## 2023-10-23 NOTE — Progress Notes (Signed)
 Janal Haak                                          MRN: [id number]   10/23/2023   The VBCI Quality Team Specialist reviewed this patient medical record for the purposes of chart review for care gap closure. The following were reviewed: chart review for care gap closure-breast cancer screening.    VBCI Quality Team

## 2023-11-21 ENCOUNTER — Telehealth: Payer: Self-pay

## 2023-11-21 NOTE — Telephone Encounter (Signed)
 Patient is overdue for an appointment. LMOM for patient to call and schedule.

## 2024-02-13 ENCOUNTER — Telehealth: Payer: Self-pay

## 2024-02-13 NOTE — Telephone Encounter (Signed)
 Copied from CRM [id number]. Topic: Clinical - Order For Equipment >> Feb 11, 2024 10:56 AM Russell PARAS wrote: Reason for CRM:   Pt has spoken with [facility] concerning her oxygen  supplies. [facility] has advised her that they require a new prescription for her concentrator and POC.   FX#([facility])  [phone]   Requested call back with status update  CB#  [phone]   ATC X1. Unable to lvm as mb is full. We have not seen her since 02-15-23 but we may can go ahead and place order?

## 2024-02-14 NOTE — Telephone Encounter (Signed)
 Pt will need appt  Will await pt to call back and schedule

## 2024-02-20 NOTE — Telephone Encounter (Signed)
 Patient has an upcoming appointment on Feb 4th with Dr.Baral.NFN

## 2024-02-26 ENCOUNTER — Encounter

## 2024-03-20 ENCOUNTER — Encounter
